# Patient Record
Sex: Female | Born: 1975 | Race: Black or African American | Hispanic: No | Marital: Single | Smoking: Never smoker
Health system: Southern US, Community
[De-identification: ages and names within clinical notes are randomized; demographics above are authoritative.]

## PROBLEM LIST (undated history)

## (undated) ENCOUNTER — Inpatient Hospital Stay (HOSPITAL_COMMUNITY): Payer: Self-pay

## (undated) DIAGNOSIS — Z789 Other specified health status: Secondary | ICD-10-CM

## (undated) DIAGNOSIS — O009 Unspecified ectopic pregnancy without intrauterine pregnancy: Secondary | ICD-10-CM

## (undated) DIAGNOSIS — G43909 Migraine, unspecified, not intractable, without status migrainosus: Secondary | ICD-10-CM

## (undated) HISTORY — PX: ECTOPIC PREGNANCY SURGERY: SHX613

## (undated) HISTORY — PX: UNILATERAL SALPINGECTOMY: SHX6160

## (undated) HISTORY — PX: DILATION AND CURETTAGE OF UTERUS: SHX78

---

## 1998-06-04 ENCOUNTER — Emergency Department (HOSPITAL_COMMUNITY): Admission: EM | Admit: 1998-06-04 | Discharge: 1998-06-04 | Payer: Self-pay | Admitting: Emergency Medicine

## 1998-10-20 ENCOUNTER — Emergency Department (HOSPITAL_COMMUNITY): Admission: EM | Admit: 1998-10-20 | Discharge: 1998-10-20 | Payer: Self-pay

## 1999-05-05 ENCOUNTER — Encounter: Admission: RE | Admit: 1999-05-05 | Discharge: 1999-05-05 | Payer: Self-pay | Admitting: Internal Medicine

## 1999-09-09 ENCOUNTER — Encounter: Admission: RE | Admit: 1999-09-09 | Discharge: 1999-09-09 | Payer: Self-pay | Admitting: Hematology and Oncology

## 1999-11-29 ENCOUNTER — Encounter: Admission: RE | Admit: 1999-11-29 | Discharge: 1999-11-29 | Payer: Self-pay | Admitting: Internal Medicine

## 1999-12-02 ENCOUNTER — Encounter: Admission: RE | Admit: 1999-12-02 | Discharge: 1999-12-02 | Payer: Self-pay

## 2000-04-27 ENCOUNTER — Encounter: Admission: RE | Admit: 2000-04-27 | Discharge: 2000-04-27 | Payer: Self-pay | Admitting: Hematology and Oncology

## 2001-07-04 ENCOUNTER — Encounter: Admission: RE | Admit: 2001-07-04 | Discharge: 2001-07-04 | Payer: Self-pay | Admitting: Internal Medicine

## 2001-12-24 ENCOUNTER — Encounter: Admission: RE | Admit: 2001-12-24 | Discharge: 2001-12-24 | Payer: Self-pay | Admitting: Internal Medicine

## 2001-12-28 ENCOUNTER — Encounter: Admission: RE | Admit: 2001-12-28 | Discharge: 2001-12-28 | Payer: Self-pay | Admitting: Internal Medicine

## 2002-01-15 ENCOUNTER — Encounter: Admission: RE | Admit: 2002-01-15 | Discharge: 2002-01-15 | Payer: Self-pay | Admitting: *Deleted

## 2002-05-13 ENCOUNTER — Inpatient Hospital Stay (HOSPITAL_COMMUNITY): Admission: AD | Admit: 2002-05-13 | Discharge: 2002-05-13 | Payer: Self-pay | Admitting: Obstetrics & Gynecology

## 2002-05-17 ENCOUNTER — Encounter (INDEPENDENT_AMBULATORY_CARE_PROVIDER_SITE_OTHER): Payer: Self-pay | Admitting: *Deleted

## 2002-05-18 ENCOUNTER — Ambulatory Visit (HOSPITAL_COMMUNITY): Admission: RE | Admit: 2002-05-18 | Discharge: 2002-05-18 | Payer: Self-pay | Admitting: Obstetrics and Gynecology

## 2003-04-13 ENCOUNTER — Emergency Department (HOSPITAL_COMMUNITY): Admission: EM | Admit: 2003-04-13 | Discharge: 2003-04-14 | Payer: Self-pay | Admitting: Emergency Medicine

## 2004-03-12 ENCOUNTER — Other Ambulatory Visit: Admission: RE | Admit: 2004-03-12 | Discharge: 2004-03-12 | Payer: Self-pay | Admitting: Obstetrics and Gynecology

## 2004-08-16 ENCOUNTER — Emergency Department (HOSPITAL_COMMUNITY): Admission: EM | Admit: 2004-08-16 | Discharge: 2004-08-16 | Payer: Self-pay | Admitting: Emergency Medicine

## 2004-12-10 ENCOUNTER — Emergency Department (HOSPITAL_COMMUNITY): Admission: EM | Admit: 2004-12-10 | Discharge: 2004-12-10 | Payer: Self-pay | Admitting: Family Medicine

## 2004-12-14 ENCOUNTER — Emergency Department (HOSPITAL_COMMUNITY): Admission: EM | Admit: 2004-12-14 | Discharge: 2004-12-14 | Payer: Self-pay | Admitting: Family Medicine

## 2004-12-16 ENCOUNTER — Emergency Department (HOSPITAL_COMMUNITY): Admission: EM | Admit: 2004-12-16 | Discharge: 2004-12-16 | Payer: Self-pay | Admitting: Emergency Medicine

## 2005-01-11 ENCOUNTER — Ambulatory Visit: Payer: Self-pay | Admitting: Internal Medicine

## 2005-01-14 ENCOUNTER — Ambulatory Visit: Payer: Self-pay | Admitting: Internal Medicine

## 2005-01-20 ENCOUNTER — Ambulatory Visit (HOSPITAL_COMMUNITY): Admission: RE | Admit: 2005-01-20 | Discharge: 2005-01-20 | Payer: Self-pay | Admitting: Internal Medicine

## 2005-02-04 ENCOUNTER — Ambulatory Visit: Payer: Self-pay | Admitting: Internal Medicine

## 2005-02-14 ENCOUNTER — Ambulatory Visit (HOSPITAL_COMMUNITY): Admission: RE | Admit: 2005-02-14 | Discharge: 2005-02-14 | Payer: Self-pay | Admitting: Nephrology

## 2005-02-24 ENCOUNTER — Ambulatory Visit: Payer: Self-pay | Admitting: Internal Medicine

## 2005-04-05 ENCOUNTER — Other Ambulatory Visit: Admission: RE | Admit: 2005-04-05 | Discharge: 2005-04-05 | Payer: Self-pay | Admitting: Obstetrics and Gynecology

## 2005-04-29 ENCOUNTER — Ambulatory Visit (HOSPITAL_COMMUNITY): Admission: RE | Admit: 2005-04-29 | Discharge: 2005-04-29 | Payer: Self-pay | Admitting: Obstetrics and Gynecology

## 2005-05-05 ENCOUNTER — Inpatient Hospital Stay (HOSPITAL_COMMUNITY): Admission: AD | Admit: 2005-05-05 | Discharge: 2005-05-05 | Payer: Self-pay | Admitting: Obstetrics and Gynecology

## 2005-07-18 ENCOUNTER — Ambulatory Visit: Payer: Self-pay | Admitting: Internal Medicine

## 2005-08-22 ENCOUNTER — Ambulatory Visit: Payer: Self-pay | Admitting: Internal Medicine

## 2005-10-05 ENCOUNTER — Emergency Department (HOSPITAL_COMMUNITY): Admission: EM | Admit: 2005-10-05 | Discharge: 2005-10-05 | Payer: Self-pay | Admitting: Family Medicine

## 2005-12-15 DIAGNOSIS — J45909 Unspecified asthma, uncomplicated: Secondary | ICD-10-CM | POA: Insufficient documentation

## 2005-12-15 DIAGNOSIS — J309 Allergic rhinitis, unspecified: Secondary | ICD-10-CM | POA: Insufficient documentation

## 2005-12-20 ENCOUNTER — Inpatient Hospital Stay (HOSPITAL_COMMUNITY): Admission: AD | Admit: 2005-12-20 | Discharge: 2005-12-20 | Payer: Self-pay | Admitting: Obstetrics and Gynecology

## 2005-12-23 ENCOUNTER — Inpatient Hospital Stay (HOSPITAL_COMMUNITY): Admission: AD | Admit: 2005-12-23 | Discharge: 2005-12-23 | Payer: Self-pay | Admitting: Obstetrics and Gynecology

## 2005-12-25 ENCOUNTER — Ambulatory Visit (HOSPITAL_COMMUNITY): Admission: AD | Admit: 2005-12-25 | Discharge: 2005-12-25 | Payer: Self-pay

## 2006-01-25 DIAGNOSIS — R1084 Generalized abdominal pain: Secondary | ICD-10-CM | POA: Insufficient documentation

## 2006-01-25 DIAGNOSIS — D3500 Benign neoplasm of unspecified adrenal gland: Secondary | ICD-10-CM

## 2006-05-19 ENCOUNTER — Ambulatory Visit: Payer: Self-pay | Admitting: Oncology

## 2006-05-29 ENCOUNTER — Emergency Department (HOSPITAL_COMMUNITY): Admission: EM | Admit: 2006-05-29 | Discharge: 2006-05-29 | Payer: Self-pay | Admitting: Family Medicine

## 2006-06-26 LAB — CONVERTED CEMR LAB: Pap Smear: NORMAL

## 2006-11-10 ENCOUNTER — Ambulatory Visit: Payer: Self-pay | Admitting: Oncology

## 2006-11-12 ENCOUNTER — Emergency Department (HOSPITAL_COMMUNITY): Admission: EM | Admit: 2006-11-12 | Discharge: 2006-11-12 | Payer: Self-pay | Admitting: Emergency Medicine

## 2006-11-14 ENCOUNTER — Telehealth: Payer: Self-pay | Admitting: *Deleted

## 2006-11-15 ENCOUNTER — Encounter (INDEPENDENT_AMBULATORY_CARE_PROVIDER_SITE_OTHER): Payer: Self-pay | Admitting: Infectious Diseases

## 2006-11-15 ENCOUNTER — Ambulatory Visit: Payer: Self-pay | Admitting: Internal Medicine

## 2006-11-15 LAB — CONVERTED CEMR LAB
ALT: 8 units/L (ref 0–35)
AST: 12 units/L (ref 0–37)
Albumin: 4.4 g/dL (ref 3.5–5.2)
Alkaline Phosphatase: 41 units/L (ref 39–117)
Anti Nuclear Antibody(ANA): NEGATIVE
BUN: 7 mg/dL (ref 6–23)
Basophils Absolute: 0 10*3/uL (ref 0.0–0.1)
Basophils Relative: 1 % (ref 0–1)
Beta hcg, urine, semiquantitative: NEGATIVE
Bilirubin Urine: NEGATIVE
CO2: 22 meq/L (ref 19–32)
Calcium: 9 mg/dL (ref 8.4–10.5)
Chloride: 106 meq/L (ref 96–112)
Creatinine, Ser: 0.65 mg/dL (ref 0.40–1.20)
Eosinophils Absolute: 0 10*3/uL (ref 0.0–0.7)
Eosinophils Relative: 1 % (ref 0–5)
Glucose, Bld: 81 mg/dL (ref 70–99)
HCT: 35.7 % — ABNORMAL LOW (ref 36.0–46.0)
Hemoglobin, Urine: NEGATIVE
Hemoglobin: 11.8 g/dL — ABNORMAL LOW (ref 12.0–15.0)
Ketones, ur: NEGATIVE mg/dL
Leukocytes, UA: NEGATIVE
Lymphocytes Relative: 48 % — ABNORMAL HIGH (ref 12–46)
Lymphs Abs: 2 10*3/uL (ref 0.7–3.3)
MCHC: 33.1 g/dL (ref 30.0–36.0)
MCV: 91.3 fL (ref 78.0–100.0)
Monocytes Absolute: 0.5 10*3/uL (ref 0.2–0.7)
Monocytes Relative: 11 % (ref 3–11)
Neutro Abs: 1.7 10*3/uL (ref 1.7–7.7)
Neutrophils Relative %: 40 % — ABNORMAL LOW (ref 43–77)
Nitrite: NEGATIVE
Platelets: 262 10*3/uL (ref 150–400)
Potassium: 4 meq/L (ref 3.5–5.3)
Protein, ur: NEGATIVE mg/dL
RBC: 3.91 M/uL (ref 3.87–5.11)
RDW: 13.1 % (ref 11.5–14.0)
Sed Rate: 12 mm/hr (ref 0–22)
Sodium: 139 meq/L (ref 135–145)
Specific Gravity, Urine: 1.011 (ref 1.005–1.03)
TSH: 0.702 microintl units/mL (ref 0.350–5.50)
Total Bilirubin: 0.9 mg/dL (ref 0.3–1.2)
Total Protein: 7.1 g/dL (ref 6.0–8.3)
Urine Glucose: NEGATIVE mg/dL
Urobilinogen, UA: 0.2 (ref 0.0–1.0)
Vitamin B-12: 582 pg/mL (ref 211–911)
WBC: 4.2 10*3/uL (ref 4.0–10.5)
pH: 6.5 (ref 5.0–8.0)

## 2006-11-17 ENCOUNTER — Ambulatory Visit: Payer: Self-pay | Admitting: Internal Medicine

## 2006-11-20 ENCOUNTER — Telehealth: Payer: Self-pay | Admitting: *Deleted

## 2007-01-29 ENCOUNTER — Ambulatory Visit: Payer: Self-pay | Admitting: Internal Medicine

## 2007-01-29 DIAGNOSIS — R209 Unspecified disturbances of skin sensation: Secondary | ICD-10-CM | POA: Insufficient documentation

## 2007-02-05 ENCOUNTER — Telehealth: Payer: Self-pay | Admitting: Internal Medicine

## 2007-02-05 ENCOUNTER — Encounter (INDEPENDENT_AMBULATORY_CARE_PROVIDER_SITE_OTHER): Payer: Self-pay | Admitting: *Deleted

## 2007-02-05 LAB — CONVERTED CEMR LAB
Free T4: 0.8 ng/dL (ref 0.6–1.6)
T3, Free: 3.1 pg/mL (ref 2.3–4.2)
TSH: 1.37 microintl units/mL (ref 0.35–5.50)

## 2007-02-14 ENCOUNTER — Telehealth: Payer: Self-pay | Admitting: Internal Medicine

## 2007-02-22 ENCOUNTER — Telehealth (INDEPENDENT_AMBULATORY_CARE_PROVIDER_SITE_OTHER): Payer: Self-pay | Admitting: *Deleted

## 2007-02-23 ENCOUNTER — Encounter: Payer: Self-pay | Admitting: Internal Medicine

## 2007-02-26 ENCOUNTER — Telehealth: Payer: Self-pay | Admitting: Internal Medicine

## 2007-04-06 ENCOUNTER — Ambulatory Visit: Payer: Self-pay | Admitting: Internal Medicine

## 2007-04-06 DIAGNOSIS — N898 Other specified noninflammatory disorders of vagina: Secondary | ICD-10-CM | POA: Insufficient documentation

## 2007-04-06 LAB — CONVERTED CEMR LAB

## 2007-05-04 ENCOUNTER — Ambulatory Visit: Payer: Self-pay | Admitting: Internal Medicine

## 2007-07-20 ENCOUNTER — Telehealth (INDEPENDENT_AMBULATORY_CARE_PROVIDER_SITE_OTHER): Payer: Self-pay | Admitting: *Deleted

## 2007-07-26 ENCOUNTER — Telehealth: Payer: Self-pay | Admitting: Internal Medicine

## 2007-07-26 ENCOUNTER — Ambulatory Visit: Payer: Self-pay | Admitting: Internal Medicine

## 2007-07-26 LAB — CONVERTED CEMR LAB: Cortisol, Plasma: 0.9 ug/dL

## 2007-08-03 ENCOUNTER — Ambulatory Visit: Payer: Self-pay | Admitting: Internal Medicine

## 2007-08-03 DIAGNOSIS — M2550 Pain in unspecified joint: Secondary | ICD-10-CM | POA: Insufficient documentation

## 2007-08-03 DIAGNOSIS — IMO0001 Reserved for inherently not codable concepts without codable children: Secondary | ICD-10-CM

## 2007-08-03 LAB — CONVERTED CEMR LAB
Cyclic Citrullin Peptide Ab: 0.8 units (ref <7)
Rhuematoid fact SerPl-aCnc: <20 intl units/mL — ABNORMAL LOW (ref 0.0–20.0)
Sed Rate: 10 mm/hr (ref 0–22)
Total CK: 114 units/L (ref 7–177)
Uric Acid, Serum: 5.3 mg/dL (ref 2.4–7.0)

## 2007-08-07 ENCOUNTER — Telehealth: Payer: Self-pay | Admitting: Internal Medicine

## 2007-08-08 ENCOUNTER — Ambulatory Visit: Payer: Self-pay | Admitting: Internal Medicine

## 2007-09-04 ENCOUNTER — Emergency Department (HOSPITAL_COMMUNITY): Admission: EM | Admit: 2007-09-04 | Discharge: 2007-09-04 | Payer: Self-pay | Admitting: Emergency Medicine

## 2007-11-26 ENCOUNTER — Ambulatory Visit: Payer: Self-pay | Admitting: Internal Medicine

## 2007-11-26 DIAGNOSIS — N760 Acute vaginitis: Secondary | ICD-10-CM | POA: Insufficient documentation

## 2007-11-26 LAB — CONVERTED CEMR LAB
Beta hcg, urine, semiquantitative: NEGATIVE
Bilirubin Urine: NEGATIVE
Chlamydia, DNA Probe: NEGATIVE
Clue Cells Wet Prep HPF POC: NONE SEEN
GC Probe Amp, Genital: NEGATIVE
Glucose, Urine, Semiquant: NEGATIVE
Ketones, urine, test strip: NEGATIVE
Nitrite: NEGATIVE
Protein, U semiquant: 100
Specific Gravity, Urine: >=1.03
Trich, Wet Prep: NONE SEEN
Urobilinogen, UA: 0.2
WBC Urine, dipstick: NEGATIVE
Yeast Wet Prep HPF POC: NONE SEEN
pH: 5

## 2007-11-27 ENCOUNTER — Telehealth: Payer: Self-pay | Admitting: Internal Medicine

## 2007-12-25 ENCOUNTER — Ambulatory Visit: Payer: Self-pay | Admitting: Internal Medicine

## 2007-12-25 DIAGNOSIS — H6520 Chronic serous otitis media, unspecified ear: Secondary | ICD-10-CM | POA: Insufficient documentation

## 2008-01-08 ENCOUNTER — Ambulatory Visit: Payer: Self-pay | Admitting: Internal Medicine

## 2008-01-08 DIAGNOSIS — N390 Urinary tract infection, site not specified: Secondary | ICD-10-CM

## 2008-01-08 DIAGNOSIS — A6000 Herpesviral infection of urogenital system, unspecified: Secondary | ICD-10-CM

## 2008-01-08 LAB — CONVERTED CEMR LAB
Bilirubin Urine: NEGATIVE
Glucose, Urine, Semiquant: NEGATIVE
Protein, U semiquant: >=300
Specific Gravity, Urine: 1.015
Urobilinogen, UA: 0.2
pH: 5

## 2008-01-09 ENCOUNTER — Encounter: Payer: Self-pay | Admitting: Internal Medicine

## 2008-01-11 ENCOUNTER — Telehealth: Payer: Self-pay | Admitting: Internal Medicine

## 2008-01-17 ENCOUNTER — Telehealth: Payer: Self-pay | Admitting: Internal Medicine

## 2008-01-22 ENCOUNTER — Ambulatory Visit: Payer: Self-pay | Admitting: Internal Medicine

## 2008-01-22 LAB — CONVERTED CEMR LAB

## 2008-01-31 ENCOUNTER — Telehealth: Payer: Self-pay | Admitting: Internal Medicine

## 2008-02-14 ENCOUNTER — Encounter: Payer: Self-pay | Admitting: Internal Medicine

## 2008-03-03 ENCOUNTER — Telehealth: Payer: Self-pay | Admitting: Internal Medicine

## 2008-04-07 ENCOUNTER — Ambulatory Visit: Payer: Self-pay | Admitting: Internal Medicine

## 2008-04-08 ENCOUNTER — Telehealth (INDEPENDENT_AMBULATORY_CARE_PROVIDER_SITE_OTHER): Payer: Self-pay | Admitting: *Deleted

## 2008-05-08 ENCOUNTER — Telehealth: Payer: Self-pay | Admitting: Internal Medicine

## 2009-06-24 ENCOUNTER — Telehealth: Payer: Self-pay | Admitting: Internal Medicine

## 2009-10-21 ENCOUNTER — Other Ambulatory Visit: Admission: RE | Admit: 2009-10-21 | Discharge: 2009-10-21 | Payer: Self-pay | Admitting: Radiology

## 2010-02-27 ENCOUNTER — Encounter: Payer: Self-pay | Admitting: Internal Medicine

## 2010-02-28 ENCOUNTER — Encounter: Payer: Self-pay | Admitting: Internal Medicine

## 2010-03-11 NOTE — Progress Notes (Signed)
Summary: [facility]  Phone Note Call from Patient Call back at Home Phone [phone]   Caller: Patient Call For: D. Thomos Lemons DO Summary of Call: patient called and left voice message requesting a rx for Valtrex.   Initial call taken by: Glendell Docker CMA,  Jun 24, 2009 12:10 PM  Follow-up for Phone Call        refill x 1 only.  needs ov before refills Follow-up by: D. Thomos Lemons DO,  Jun 24, 2009 12:11 PM  Additional Follow-up for Phone Call Additional follow up Details #1::        attempted to contact patient at [phone], no answer, detailed voice message left informing patient per Dr Artist Pais instructions Additional Follow-up by: Glendell Docker CMA,  Jun 24, 2009 1:01 PM    Prescriptions: ACYCLOVIR 400 MG TABS (ACYCLOVIR) one by mouth bid  #60 x 0   Entered by:   Glendell Docker CMA   Authorized by:   D. Thomos Lemons DO   Signed by:   Glendell Docker CMA on 06/24/2009   Method used:   Electronically to        [facility]. [phone]* (retail)       [address]       [place], [place]  [address]       Ph: [phone]       Fax: [phone]   RxID:   [id number]

## 2010-06-25 NOTE — Op Note (Signed)
Alicia Johnson, Alicia Johnson                         ACCOUNT NO.:  [id number]   MEDICAL RECORD NO.:  [id number]                   PATIENT TYPE:  AMB   LOCATION:  SDC                                  FACILITY:  [facility]   PHYSICIAN:  Miguel Aschoff, M.D.                    DATE OF BIRTH:  [birth year]   DATE OF PROCEDURE:  05/18/2002  DATE OF DISCHARGE:                                 OPERATIVE REPORT   PREOPERATIVE DIAGNOSIS:  Missed abortion.   POSTOPERATIVE DIAGNOSIS:  Missed abortion.   PROCEDURE:  Suction curettage.   SURGEON:  Miguel Aschoff, M.D.   ANESTHESIA:  IV sedation with paracervical block.   COMPLICATIONS:  None.   INDICATIONS:  The patient is a 35 year old black female gravida 1, para 0  who shows to be at 10 weeks' gestation by dates.  Serial ultrasound  examination has revealed a fetal pole consistent with six weeks with no  fetal heart activity on serial observation with malformation of the  gestational sac and no yolk sac noted.  In addition, the patient's hCG fell  from 24,000 down 16,000 associated with vaginal bleeding.  With the  ultrasounds and falling hCGs, diagnosis of missed abortion has been made,  and the patient is to undergo a suction curettage to evacuate the uterus.   Risks and benefits of the procedure were discussed with the patient, and  informed consent has been obtained.   DESCRIPTION OF PROCEDURE:  The patient was taken to the operating room and  placed in the supine position.  IV sedation was administered.  She was then  placed in the dorsal lithotomy position, prepped and draped in the usual  sterile fashion.  The bladder was catheterized.  Examination revealed the  uterus to be anterior, approximately 8 weeks in size.  Specimen was placed  in the vaginal vault.  A small amount of blood was noted to be coming  through the cervix.  The cervix was injected with 18 mL of 1% Xylocaine by  placing 6 mL at the 12, 4 and 8 o'clock positions on the cervix.   The  cervical canal was dilated using serial Pratt dilators until a #25 Pratt  dilator could be passed.  A vacuum curet was then used, and the contents of  the uterus were evacuated without difficulty.  Sharp curettage was carried  out yielding only a small amount of additional tissue.  A final pass was  made with the suction curet, and at this point with the uterus being empty,  the procedure was completed.  The instruments were removed.  Hemostasis was  readily achieved.  At this point, the patient was taken out of the lithotomy  position and taken to the recovery room in satisfactory condition.  Estimated blood loss was approximately 75 mL.   PLAN:  The plan is for the patient to be discharged home.  She  will be seen  back in four weeks for followup examination.  She is to call if there are  any problems such as fever, pain or heavy bleeding.  She is instructed to  place nothing in the vagina for four weeks.  The patient's blood type is O  negative.  She will receive RhoGAM.    DISCHARGE MEDICATIONS:  1. Doxycycline 100 mg twice a day x3 days.  2. Darvocet-N 100 mg as needed for pain.                                                Miguel Aschoff, M.D.    AR/MEDQ  D:  05/18/2002  T:  05/18/2002  Job:  [id number]

## 2010-06-25 NOTE — Op Note (Signed)
Alicia Johnson, Alicia Johnson               ACCOUNT NO.:  [id number]   MEDICAL RECORD NO.:  [id number]          PATIENT TYPE:  AMB   LOCATION:  SDC                           FACILITY:  [facility]   PHYSICIAN:  Malva Limes, M.D.    DATE OF BIRTH:  [birth year]   DATE OF PROCEDURE:  12/25/2005  DATE OF DISCHARGE:                                 OPERATIVE REPORT   PREOPERATIVE DIAGNOSIS:  Ruptured ectopic pregnancy.   POSTOPERATIVE DIAGNOSIS:  Unruptured right ampullar ectopic pregnancy with 8  cm clot.   PROCEDURES:  1. Diagnostic laparoscopy.  2. Removal of hematoperitoneum.  3. Salpingostomy.   SURGEON:  Dr. Dareen Piano.   ANESTHESIA:  General endotracheal.   ANTIBIOTICS:  Ancef 1 gram.   DRAINS:  Foley bedside drainage.   ESTIMATED BLOOD LOSS:  150 mL.   COMPLICATIONS:  None.   SPECIMENS:  None.   INDICATIONS:  [name] is a 35 year old black female with suspected ectopic  pregnancy, last seen on 12/23/2005.  At that time the patient had an  ultrasound which revealed no hematometrium and a questionable 4 cm mass in  the right adnexa.  The patient's quants had been dropping.  She did receive  methotrexate on that day.  The patient returned today with increased amount  of pain, an ultrasound was performed and the patient was found to have an 8  cm mass.  In light of this, the patient was taken to the operating room for  salpingostomy.   PROCEDURE:  The patient was prepped with Betadine and placed in the dorsal  lithotomy position.  Foley catheter was placed in her bladder.  Hulka  tenaculum was applied to the anterior cervical lip.  The umbilicus was then  opened in a linear fashion.  The fascia was grasped, opened, the parietal  peritoneum opened and a suture placed.  Hasson cannula was placed in the  peritoneal cavity and 3 liters of carbon dioxide were insufflated.  The  patient was placed in Trendelenburg.  On placement of the scope, the patient  was found to have hematometrium  with a large clot in right adnexa.  A 10-mm  port was placed in the suprapubic region and a 5-mm port placed in the right  lower quadrant, under direct visualization.  At this point, the suction  irrigator was used to suck out the hematoperitoneum and remove the clot.  When this was done the patient was discovered to have a 2 cm ectopic in the  right ampullary portion.  The needlepoint gyrus was then used to open the  antimesenteric portion of the tube over the ectopic.  The suction irrigator  was placed into the opening and fluid used to push the ectopic pregnancy  out.  This was then sucked up into the canister.  This concluded the  procedure.  Minimal bleeding was noted after that.  The appendix appeared to  be normal.  Ovaries were normal.  The other fallopian tube appeared to be  normal.  There was no evidence of endometriosis or adhesions.  The fimbria  on both tubes appeared to be  normal.  The patient had no evidence of past  PID.  At this point, pneumoperitoneum was released.  The fascia was closed  with 0 Monocryl suture and skin closed with 4-0 Vicryl suture.  The lower  incisions were closed with Dermabond.  The patient tolerated the procedure  well.  She was discharged to home.  She was sent home with Percocet to take  p.r.n.  She will follow up in the office in two weeks.           ______________________________  Malva Limes, M.D.     MA/MEDQ  D:  12/25/2005  T:  12/25/2005  Job:  [id number]

## 2010-09-06 ENCOUNTER — Emergency Department (HOSPITAL_COMMUNITY)
Admission: EM | Admit: 2010-09-06 | Discharge: 2010-09-07 | Disposition: A | Discharge status: Home / Self Care | Payer: BC Managed Care – PPO | Attending: Emergency Medicine | Admitting: Emergency Medicine

## 2010-09-06 DIAGNOSIS — R209 Unspecified disturbances of skin sensation: Secondary | ICD-10-CM | POA: Insufficient documentation

## 2010-09-06 DIAGNOSIS — R229 Localized swelling, mass and lump, unspecified: Secondary | ICD-10-CM | POA: Insufficient documentation

## 2010-09-07 LAB — BASIC METABOLIC PANEL
CO2: 27 mEq/L (ref 19–32)
Calcium: 9.1 mg/dL (ref 8.4–10.5)
Creatinine, Ser: 0.58 mg/dL (ref 0.50–1.10)
GFR calc non Af Amer: >60 mL/min (ref >60)
Glucose, Bld: 91 mg/dL (ref 70–99)
Sodium: 135 mEq/L (ref 135–145)

## 2010-09-07 LAB — D-DIMER, QUANTITATIVE: D-Dimer, Quant: 0.29 ug/mL-FEU (ref 0.00–0.48)

## 2010-11-24 ENCOUNTER — Other Ambulatory Visit: Payer: Self-pay | Admitting: Family Medicine

## 2010-11-24 DIAGNOSIS — R1030 Lower abdominal pain, unspecified: Secondary | ICD-10-CM

## 2010-11-29 ENCOUNTER — Other Ambulatory Visit: Payer: Self-pay | Admitting: Internal Medicine

## 2010-11-29 DIAGNOSIS — R1030 Lower abdominal pain, unspecified: Secondary | ICD-10-CM

## 2010-11-30 ENCOUNTER — Ambulatory Visit
Admission: RE | Admit: 2010-11-30 | Discharge: 2010-11-30 | Disposition: A | Discharge status: Home / Self Care | Payer: BC Managed Care – PPO | Source: Ambulatory Visit | Attending: Family Medicine | Admitting: Family Medicine

## 2010-11-30 DIAGNOSIS — R1030 Lower abdominal pain, unspecified: Secondary | ICD-10-CM

## 2010-11-30 MED ORDER — IOHEXOL 300 MG/ML  SOLN
100.0000 mL | Freq: Once | INTRAMUSCULAR | Status: AC | PRN
  Administered 2010-11-30: 100 mL via INTRAVENOUS

## 2010-12-27 ENCOUNTER — Ambulatory Visit: Payer: No Typology Code available for payment source

## 2010-12-27 NOTE — Progress Notes (Signed)
Pt did not know what cancer her mother had at age 35. She will email the info and then we can determine if any testing is indicated. CSN: [id number]

## 2011-01-24 ENCOUNTER — Ambulatory Visit: Payer: No Typology Code available for payment source

## 2011-03-09 ENCOUNTER — Telehealth: Payer: Self-pay | Admitting: Genetic Counselor

## 2011-03-14 ENCOUNTER — Telehealth: Payer: Self-pay | Admitting: Genetic Counselor

## 2011-09-20 ENCOUNTER — Telehealth: Payer: Self-pay | Admitting: Genetic Counselor

## 2011-09-20 NOTE — Telephone Encounter (Signed)
Emailed Ms. Wannamaker about information I found out about her mother Astryd Pearcy.  Keta called last week wondering why she had not been tested for BRCA mutations, and that she now wants to be tested b/c her maternal aunt has recently been diagnosed with breast cancer.  She states that her mother died at age 36 from lung cancer, and that she was followed by Dr. Yolanda Bonine.  She asked that we keep in touch by email: [email or web address].

## 2011-09-21 ENCOUNTER — Telehealth: Payer: Self-pay | Admitting: *Deleted

## 2011-09-21 NOTE — Telephone Encounter (Signed)
Patient called requesting a genetic appt.  Confirmed 11/03/11 genetic appt w/ pt.

## 2011-11-03 ENCOUNTER — Other Ambulatory Visit: Payer: BC Managed Care – PPO | Admitting: Lab

## 2011-11-03 ENCOUNTER — Encounter: Payer: BC Managed Care – PPO | Admitting: Genetic Counselor

## 2011-11-03 ENCOUNTER — Telehealth: Payer: Self-pay | Admitting: *Deleted

## 2011-11-03 NOTE — Telephone Encounter (Signed)
Patient called stating that she wants to cancel her appt for today and wait until her aunt is tested on Monday and they get the results back.

## 2011-11-07 ENCOUNTER — Telehealth: Payer: Self-pay | Admitting: *Deleted

## 2011-11-07 NOTE — Telephone Encounter (Signed)
Patient called and I confirmed 01/12/12 genetic appt w/ her.

## 2011-11-11 ENCOUNTER — Emergency Department (HOSPITAL_COMMUNITY)
Admission: EM | Admit: 2011-11-11 | Discharge: 2011-11-12 | Disposition: A | Discharge status: Home / Self Care | Payer: BC Managed Care – PPO | Attending: Emergency Medicine | Admitting: Emergency Medicine

## 2011-11-11 ENCOUNTER — Encounter (HOSPITAL_COMMUNITY): Payer: Self-pay

## 2011-11-11 DIAGNOSIS — S161XXA Strain of muscle, fascia and tendon at neck level, initial encounter: Secondary | ICD-10-CM

## 2011-11-11 DIAGNOSIS — S139XXA Sprain of joints and ligaments of unspecified parts of neck, initial encounter: Secondary | ICD-10-CM | POA: Insufficient documentation
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
## 2011-11-12 MED ORDER — CYCLOBENZAPRINE HCL 10 MG PO TABS: 10.0000 mg | ORAL_TABLET | Freq: Two times a day (BID) | ORAL | Status: DC | PRN

## 2011-11-12 NOTE — ED Notes (Signed)
Patient given copy of discharge paperwork; went over discharge instructions with patient.  Patient instructed to take prescriptions as directed; to follow up with primary care physician, and to return to the ED for new, worsening, or concerning symptoms.

## 2011-12-01 ENCOUNTER — Encounter (HOSPITAL_COMMUNITY): Payer: Self-pay | Admitting: *Deleted

## 2011-12-01 ENCOUNTER — Emergency Department (HOSPITAL_COMMUNITY)
Admission: EM | Admit: 2011-12-01 | Discharge: 2011-12-01 | Disposition: A | Discharge status: Home / Self Care | Payer: BC Managed Care – PPO | Source: Home / Self Care | Attending: Emergency Medicine | Admitting: Emergency Medicine

## 2011-12-01 DIAGNOSIS — J329 Chronic sinusitis, unspecified: Secondary | ICD-10-CM

## 2011-12-01 MED ORDER — AZITHROMYCIN 250 MG PO TABS: ORAL_TABLET | ORAL | Status: DC

## 2011-12-01 MED ORDER — CETIRIZINE HCL 10 MG PO CHEW: 10.0000 mg | CHEWABLE_TABLET | Freq: Every day | ORAL | Status: DC

## 2011-12-01 MED ORDER — SALINE NASAL SPRAY 0.65 % NA SOLN: 1.0000 | NASAL | Status: DC | PRN

## 2011-12-01 NOTE — ED Provider Notes (Signed)
History     CSN: [id number]  Arrival date & time 12/01/11  1729   None     Chief Complaint  Patient presents with  . URI    (Consider location/radiation/quality/duration/timing/severity/associated sxs/prior treatment) Patient is a 36 y.o. female presenting with URI. The history is provided by the patient.  URI The primary symptoms include cough. Primary symptoms do not include fever or wheezing. The current episode started more than 1 week ago. This is a new problem. The problem has not changed since onset. The onset of the illness is associated with exposure to sick contacts. Symptoms associated with the illness include chills, plugged ear sensation, facial pain, sinus pressure, congestion and rhinorrhea. The following treatments were addressed: Acetaminophen was effective (somewhat). A decongestant was effective.    History reviewed. No pertinent past medical history.  History reviewed. No pertinent past surgical history.  History reviewed. No pertinent family history.  History  Substance Use Topics  . Smoking status: Never Smoker   . Smokeless tobacco: Not on file  . Alcohol Use: No    OB History    Grav Para Term Preterm Abortions TAB SAB Ect Mult Living                  Review of Systems  Constitutional: Positive for chills. Negative for fever.  HENT: Positive for congestion, rhinorrhea and sinus pressure.   Respiratory: Positive for cough. Negative for choking, chest tightness and wheezing.   Cardiovascular: Negative.     Allergies  Review of patient's allergies indicates no known allergies.  Home Medications   Current Outpatient Rx  Name Route Sig Dispense Refill  . AZITHROMYCIN 250 MG PO TABS  Azithromycin 500mg  on day 1, then 250mg  on days 2-4.  Rx to be filled 12/04/11 thru 12/08/11 only. 6 tablet 0  . CETIRIZINE HCL 10 MG PO CHEW Oral Chew 1 tablet (10 mg total) by mouth daily. 30 tablet 1  . CYCLOBENZAPRINE HCL 10 MG PO TABS Oral Take 1 tablet (10  mg total) by mouth 2 (two) times daily as needed for muscle spasms. 20 tablet 0  . SALINE NASAL SPRAY 0.65 % NA SOLN Nasal Place 1 spray into the nose as needed for congestion. 30 mL 12    BP 135/92  Pulse 79  Temp 98.9 F (37.2 C) (Oral)  Resp 20  SpO2 100%  LMP 11/02/2011  Physical Exam  Nursing note and vitals reviewed. Constitutional: She is oriented to person, place, and time. Vital signs are normal. She appears well-developed and well-nourished. She is active and cooperative. No distress.  HENT:  Head: Normocephalic.  Right Ear: External ear normal.  Left Ear: External ear normal.  Nose: Nose normal.  Mouth/Throat: Oropharynx is clear and moist.       Fluid in right TM, left TM erythema  Eyes: Conjunctivae normal and EOM are normal. Pupils are equal, round, and reactive to light. No scleral icterus.  Neck: Trachea normal and normal range of motion. Neck supple.  Cardiovascular: Normal rate, regular rhythm and normal heart sounds.   Pulmonary/Chest: Effort normal and breath sounds normal. No respiratory distress. She has no wheezes.  Musculoskeletal: Normal range of motion.  Lymphadenopathy:    She has no cervical adenopathy.  Neurological: She is alert and oriented to person, place, and time. No cranial nerve deficit or sensory deficit.  Skin: Skin is warm and dry. No rash noted. She is not diaphoretic.  Psychiatric: She has a normal mood and affect. Her  speech is normal and behavior is normal. Judgment and thought content normal. Cognition and memory are normal.    ED Course  Procedures (including critical care time)  Labs Reviewed - No data to display No results found.   1. Sinusitis       MDM  Antihistamine of your choice, nasal saline spray daily.  Begin antibiotics if symptoms are not improved in 3-4 days.          Johnsie Kindred, NP 12/01/11 1831

## 2011-12-01 NOTE — ED Notes (Signed)
C/o sorethroat onset Wed. of last week, then got cough, ears stopped up, and a little runny nose- more stuffy.  Had some chills but did not note a fever.

## 2011-12-02 NOTE — ED Provider Notes (Signed)
Medical screening examination/treatment/procedure(s) were performed by non-physician practitioner and as supervising physician I was immediately available for consultation/collaboration.  Carneshia Raker, M.D.   Olga Bourbeau C Witten Certain, MD 12/02/11 1256 

## 2011-12-23 ENCOUNTER — Inpatient Hospital Stay (HOSPITAL_COMMUNITY)
Admission: AD | Admit: 2011-12-23 | Discharge: 2011-12-23 | Disposition: A | Discharge status: Home / Self Care | Payer: BC Managed Care – PPO | Source: Ambulatory Visit | Attending: Obstetrics and Gynecology | Admitting: Obstetrics and Gynecology

## 2011-12-23 ENCOUNTER — Encounter (HOSPITAL_COMMUNITY): Payer: Self-pay | Admitting: *Deleted

## 2011-12-23 DIAGNOSIS — O00109 Unspecified tubal pregnancy without intrauterine pregnancy: Secondary | ICD-10-CM | POA: Insufficient documentation

## 2011-12-23 LAB — CBC WITH DIFFERENTIAL/PLATELET
Eosinophils Absolute: 0.2 10*3/uL (ref 0.0–0.7)
HCT: 31.7 % — ABNORMAL LOW (ref 36.0–46.0)
Hemoglobin: 10.6 g/dL — ABNORMAL LOW (ref 12.0–15.0)
Lymphs Abs: 2.4 10*3/uL (ref 0.7–4.0)
MCH: 30.2 pg (ref 26.0–34.0)
MCHC: 33.4 g/dL (ref 30.0–36.0)
Monocytes Absolute: 0.7 10*3/uL (ref 0.1–1.0)
Monocytes Relative: 10 % (ref 3–12)
Neutrophils Relative %: 53 % (ref 43–77)
RBC: 3.51 MIL/uL — ABNORMAL LOW (ref 3.87–5.11)

## 2011-12-23 LAB — TYPE AND SCREEN
ABO/RH(D): O NEG
Antibody Screen: NEGATIVE

## 2011-12-23 MED ORDER — METHOTREXATE INJECTION FOR WOMEN'S HOSPITAL
50.0000 mg/m2 | Freq: Once | INTRAMUSCULAR | Status: AC
  Administered 2011-12-23: 100 mg via INTRAMUSCULAR
  Filled 2011-12-23: qty 2

## 2011-12-23 MED ORDER — RHO D IMMUNE GLOBULIN 1500 UNIT/2ML IJ SOLN
300.0000 ug | Freq: Once | INTRAMUSCULAR | Status: AC
  Administered 2011-12-23: 300 ug via INTRAMUSCULAR
  Filled 2011-12-23: qty 2

## 2011-12-23 NOTE — MAU Provider Note (Signed)
Patient presented to office 11/13 with early pregnancy, bleeding, and abdominal pain.  Ultrasound showed an empty uterus with thickened endometrium.  Moderate free fluid was noted in the pelvis.  No adnexal masses were noted.  Her Quant. HCG was 571 Units.  Today she reports that her symptoms are no worse or better.  Repeat HCG was 553 Units.  Impression:  Based on plateau values of HCG, bleeding, and ultrasound I believe that there is no possibility of this being a normal pregnancy.  She has a history of previous ectopic and I believe that her presentation is most consistent with another ectopic pregnancy although missed ab is also a possibility.  I discussed this with the patient and will proceed with Methotrexate therapy today. Plan:  Mtx 50 mg/m2 IM, RhoGAM, CBC.  Will present to the office on 11/19 and 11/22 for follow up HCG's.

## 2011-12-23 NOTE — Progress Notes (Signed)
Written and verbal d/c instructions given and understanding voiced. To f/u at office on Tues. and call with any concerns before then. Has handouts for Rhophylac, MTX, Ectopic folder.

## 2011-12-23 NOTE — MAU Note (Signed)
They have been following me in office and decided I have an ectopic. I'm here MTX and I am O neg so need that shot

## 2011-12-23 NOTE — MAU Note (Signed)
CBC with diff results called to Dr Arlyce Dice. Does not want CMET run and is ok with proceeding with MTX. Pt will f/u in office on Tues and Fri and pt is aware and agrees with plan.

## 2011-12-24 LAB — RH IG WORKUP (INCLUDES ABO/RH)
ABO/RH(D): O NEG
Gestational Age(Wks): 6
Unit division: 0

## 2012-01-12 ENCOUNTER — Other Ambulatory Visit: Payer: BC Managed Care – PPO | Admitting: Lab

## 2012-01-12 ENCOUNTER — Other Ambulatory Visit: Payer: BC Managed Care – PPO

## 2012-01-12 ENCOUNTER — Ambulatory Visit (HOSPITAL_BASED_OUTPATIENT_CLINIC_OR_DEPARTMENT_OTHER): Payer: BC Managed Care – PPO | Admitting: Genetic Counselor

## 2012-01-12 ENCOUNTER — Encounter: Payer: Self-pay | Admitting: Genetic Counselor

## 2012-01-12 ENCOUNTER — Encounter: Payer: BC Managed Care – PPO | Admitting: Genetic Counselor

## 2012-01-12 DIAGNOSIS — IMO0002 Reserved for concepts with insufficient information to code with codable children: Secondary | ICD-10-CM

## 2012-01-12 DIAGNOSIS — Z803 Family history of malignant neoplasm of breast: Secondary | ICD-10-CM

## 2012-01-12 NOTE — Progress Notes (Signed)
Dr.  Jeralyn Ruths requested a consultation for genetic counseling and risk assessment for Alicia Johnson, a 36 y.o. female, for discussion of her family history of breast and colon cancer. She presents to clinic today to discuss the possibility of a genetic predisposition to cancer, and to further clarify her risks, as well as her family members' risks for cancer.   HISTORY OF PRESENT ILLNESS: Alicia Johnson is a 36 y.o. female with no personal history of cancer.    History reviewed. No pertinent past medical history.  History reviewed. No pertinent past surgical history.  History  Substance Use Topics  . Smoking status: Never Smoker   . Smokeless tobacco: Not on file  . Alcohol Use: No    REPRODUCTIVE HISTORY AND PERSONAL RISK ASSESSMENT FACTORS: Menarche was at age 57.   Premenopausal Uterus Intact: Yes Ovaries Intact: Yes [id number] , first live birth at age N/A  She has not previously undergone treatment for infertility.   OCP use for 4-5 years   She has not used HRT in the past.    FAMILY HISTORY:  We obtained a detailed, 4-generation family history.  Significant diagnoses are listed below: Family History  Problem Relation Age of Onset  . Breast cancer Mother 79  . Breast cancer Maternal Aunt 49    tested negative for BRCAPlus  . Colon cancer Maternal Grandfather 43  The patient has never been diagnosed with cancer.  Her mother was diagnosed with breast cancer at age 36 and died at age 89.  Her mother had three sisters and four brothers.  One sister was diagnosed with breast cancer at age 29 and tested negative for BRCAPlus - which looks at 6 high risk hereditary cancer genes.  The patient's maternal grandfather was diagnosed with colon cancer at age 62 and died at 64.  There is no other reported cancer history.  Patient's maternal ancestors are of [place], [place] and Caucasian descent, and paternal ancestors are of African American descent. There is  no reported Ashkenazi Jewish ancestry. There is no  known consanguinity.  GENETIC COUNSELING RISK ASSESSMENT, DISCUSSION, AND SUGGESTED FOLLOW UP: We reviewed the natural history and genetic etiology of sporadic, familial and hereditary cancer syndromes.  About 5-10% of breast cancer is hereditary.  Of this, about 85% is the result of a BRCA1 or BRCA2 mutation.  We reviewed the red flags of hereditary cancer syndromes and the dominant inheritance patterns.  If the BRCA testing is negative, we discussed that we could be testing for the wrong gene.  We discussed gene panels, and that several cancer genes that are associated with different cancers can be tested at the same time.  Because of the different types of cancer that are in the patient's family, we will consider one of the panel tests if she is negative for BRCA mutations.   The patient's family history is suggestive of the following possible diagnosis: hereditary cancer syndrome  We discussed that identification of a hereditary cancer syndrome may help her care providers tailor the patients medical management. If a mutation indicating a hereditary cancer syndrome is detected in this case, the [employer] recommendations would include increased cancer screening and possible prophylactic surgery. If a mutation is detected, the patient will be referred back to the referring provider and to any additional appropriate care providers to discuss the relevant options.   If a mutation is not found in the patient, cancer surveillance options would be discussed for the  patient according to the appropriate standard [employer] and American Cancer Society guidelines, with consideration of their personal and family history risk factors. In this case, the patient will be referred back to their care providers for discussions of management.   In order to estimate her chance of having a BRCA mutation, we used  statistical models ([employer]) and laboratory data that take into account her personal medical history, family history and ancestry.  Because each model is different, there can be a lot of variability in the risks they give.  Therefore, these numbers must be considered a rough range and not a precise risk of having a BRCA mutation.  These models estimate that she has approximately a 0.56-10% chance of having a mutation. Based on this assessment of her family and personal history, genetic testing is recommended.  Based on the patient's personal and family history, statistical models (Tyrer Cusik)  and literature data were used to estimate her risk of developing breast cancer. These estimate her lifetime risk of developing breast cancer to be approximately 36%. This estimation does not take into account any genetic testing results.   After considering the risks, benefits, and limitations, the patient provided informed consent for  the following  testing: Breast and Ovarian cancer panel through [facility].   Per the patient's request, we will contact her by telephone to discuss these results. A follow up genetic counseling visit will be scheduled if indicated.  The patient was seen for a total of 60 minutes, greater than 50% of which was spent face-to-face counseling.  This plan is being carried out per Dr. Wilson Singer recommendations.  This note will also be sent to the referring provider via the electronic medical record. The patient will be supplied with a summary of this genetic counseling discussion as well as educational information on the discussed hereditary cancer syndromes following the conclusion of their visit.   Patient was discussed with Dr. Drue Second.    _______________________________________________________________________ For Office Staff:  Number of people involved in session: 2 Was an Intern/ student involved with case: no

## 2012-01-14 ENCOUNTER — Observation Stay (HOSPITAL_COMMUNITY)
Admission: AD | Admit: 2012-01-14 | Discharge: 2012-01-15 | Disposition: A | Discharge status: Home / Self Care | Payer: BC Managed Care – PPO | Source: Ambulatory Visit | Attending: Obstetrics and Gynecology | Admitting: Obstetrics and Gynecology

## 2012-01-14 ENCOUNTER — Inpatient Hospital Stay (HOSPITAL_COMMUNITY): Payer: BC Managed Care – PPO

## 2012-01-14 ENCOUNTER — Encounter (HOSPITAL_COMMUNITY): Payer: Self-pay | Admitting: Anesthesiology

## 2012-01-14 ENCOUNTER — Ambulatory Visit: Admit: 2012-01-14 | Payer: Self-pay | Admitting: Obstetrics and Gynecology

## 2012-01-14 ENCOUNTER — Encounter (HOSPITAL_COMMUNITY): Payer: Self-pay | Admitting: *Deleted

## 2012-01-14 ENCOUNTER — Encounter (HOSPITAL_COMMUNITY): Payer: Self-pay

## 2012-01-14 ENCOUNTER — Encounter (HOSPITAL_COMMUNITY)
Admission: AD | Disposition: A | Discharge status: Home / Self Care | Payer: Self-pay | Source: Ambulatory Visit | Attending: Obstetrics and Gynecology

## 2012-01-14 ENCOUNTER — Inpatient Hospital Stay (HOSPITAL_COMMUNITY): Payer: BC Managed Care – PPO | Admitting: Anesthesiology

## 2012-01-14 DIAGNOSIS — O00109 Unspecified tubal pregnancy without intrauterine pregnancy: Principal | ICD-10-CM | POA: Insufficient documentation

## 2012-01-14 DIAGNOSIS — R109 Unspecified abdominal pain: Secondary | ICD-10-CM | POA: Insufficient documentation

## 2012-01-14 DIAGNOSIS — D649 Anemia, unspecified: Secondary | ICD-10-CM | POA: Insufficient documentation

## 2012-01-14 DIAGNOSIS — K661 Hemoperitoneum: Secondary | ICD-10-CM | POA: Insufficient documentation

## 2012-01-14 HISTORY — DX: Other specified health status: Z78.9

## 2012-01-14 HISTORY — DX: Unspecified ectopic pregnancy without intrauterine pregnancy: O00.90

## 2012-01-14 HISTORY — PX: LAPAROSCOPY: SHX197

## 2012-01-14 LAB — CBC
Hemoglobin: 11.7 g/dL — ABNORMAL LOW (ref 12.0–15.0)
MCH: 30 pg (ref 26.0–34.0)
MCHC: 33.1 g/dL (ref 30.0–36.0)
MCV: 90.5 fL (ref 78.0–100.0)
RBC: 3.9 MIL/uL (ref 3.87–5.11)

## 2012-01-14 SURGERY — LAPAROSCOPY OPERATIVE
Anesthesia: General | Site: Abdomen

## 2012-01-14 MED ORDER — HYDROMORPHONE HCL PF 1 MG/ML IJ SOLN
1.0000 mg | Freq: Once | INTRAMUSCULAR | Status: AC
  Administered 2012-01-14: 1 mg via INTRAVENOUS
  Filled 2012-01-14: qty 1

## 2012-01-14 MED ORDER — MENTHOL 3 MG MT LOZG: 1.0000 | LOZENGE | OROMUCOSAL | Status: DC | PRN

## 2012-01-14 MED ORDER — METOCLOPRAMIDE HCL 5 MG/ML IJ SOLN: 10.0000 mg | Freq: Once | INTRAMUSCULAR | Status: DC | PRN

## 2012-01-14 MED ORDER — OXYCODONE-ACETAMINOPHEN 5-325 MG PO TABS
1.0000 | ORAL_TABLET | ORAL | Status: DC | PRN
  Administered 2012-01-15 (×2): 1 via ORAL
  Filled 2012-01-14 (×2): qty 1

## 2012-01-14 MED ORDER — DIPHENHYDRAMINE HCL 12.5 MG/5ML PO ELIX
12.5000 mg | ORAL_SOLUTION | Freq: Four times a day (QID) | ORAL | Status: DC | PRN
  Filled 2012-01-14: qty 5

## 2012-01-14 MED ORDER — NALOXONE HCL 0.4 MG/ML IJ SOLN: 0.4000 mg | INTRAMUSCULAR | Status: DC | PRN

## 2012-01-14 MED ORDER — LACTATED RINGERS IV SOLN
INTRAVENOUS | Status: DC
  Administered 2012-01-14 – 2012-01-15 (×3): via INTRAVENOUS

## 2012-01-14 MED ORDER — MIDAZOLAM HCL 5 MG/5ML IJ SOLN
INTRAMUSCULAR | Status: DC | PRN
  Administered 2012-01-14: 2 mg via INTRAVENOUS

## 2012-01-14 MED ORDER — LACTATED RINGERS IV SOLN
INTRAVENOUS | Status: DC | PRN
  Administered 2012-01-14 (×3): via INTRAVENOUS

## 2012-01-14 MED ORDER — ROCURONIUM BROMIDE 100 MG/10ML IV SOLN
INTRAVENOUS | Status: DC | PRN
  Administered 2012-01-14: 30 mg via INTRAVENOUS
  Administered 2012-01-14: 2.5 mg via INTRAVENOUS

## 2012-01-14 MED ORDER — FENTANYL CITRATE 0.05 MG/ML IJ SOLN
INTRAMUSCULAR | Status: DC | PRN
  Administered 2012-01-14: 150 ug via INTRAVENOUS
  Administered 2012-01-14 (×2): 100 ug via INTRAVENOUS

## 2012-01-14 MED ORDER — HYDROMORPHONE HCL PF 1 MG/ML IJ SOLN: 0.2500 mg | INTRAMUSCULAR | Status: DC | PRN

## 2012-01-14 MED ORDER — ONDANSETRON HCL 4 MG/2ML IJ SOLN
INTRAMUSCULAR | Status: DC | PRN
  Administered 2012-01-14: 4 mg via INTRAVENOUS

## 2012-01-14 MED ORDER — LACTATED RINGERS IV SOLN: INTRAVENOUS | Status: DC

## 2012-01-14 MED ORDER — GLYCOPYRROLATE 0.2 MG/ML IJ SOLN
INTRAMUSCULAR | Status: DC | PRN
  Administered 2012-01-14: .8 mg via INTRAVENOUS

## 2012-01-14 MED ORDER — ONDANSETRON HCL 4 MG/2ML IJ SOLN
4.0000 mg | Freq: Four times a day (QID) | INTRAMUSCULAR | Status: DC | PRN
  Administered 2012-01-14: 4 mg via INTRAVENOUS
  Filled 2012-01-14: qty 2

## 2012-01-14 MED ORDER — LIDOCAINE-EPINEPHRINE (PF) 1 %-1:200000 IJ SOLN
INTRAMUSCULAR | Status: DC | PRN
  Administered 2012-01-14: 7 mL

## 2012-01-14 MED ORDER — DEXTROSE 5 % IV SOLN
2.0000 g | INTRAVENOUS | Status: DC | PRN
  Administered 2012-01-14: 2 g via INTRAVENOUS

## 2012-01-14 MED ORDER — DIPHENHYDRAMINE HCL 50 MG/ML IJ SOLN: 12.5000 mg | Freq: Four times a day (QID) | INTRAMUSCULAR | Status: DC | PRN

## 2012-01-14 MED ORDER — CEFAZOLIN SODIUM-DEXTROSE 2-3 GM-% IV SOLR
2.0000 g | Freq: Once | INTRAVENOUS | Status: DC
  Filled 2012-01-14: qty 50

## 2012-01-14 MED ORDER — SUCCINYLCHOLINE CHLORIDE 20 MG/ML IJ SOLN
INTRAMUSCULAR | Status: DC | PRN
  Administered 2012-01-14: 100 mg via INTRAVENOUS

## 2012-01-14 MED ORDER — PROPOFOL 10 MG/ML IV EMUL
INTRAVENOUS | Status: DC | PRN
  Administered 2012-01-14: 160 mg via INTRAVENOUS

## 2012-01-14 MED ORDER — DEXAMETHASONE SODIUM PHOSPHATE 4 MG/ML IJ SOLN
INTRAMUSCULAR | Status: DC | PRN
  Administered 2012-01-14: 10 mg via INTRAVENOUS

## 2012-01-14 MED ORDER — METOCLOPRAMIDE HCL 5 MG/ML IJ SOLN
10.0000 mg | Freq: Once | INTRAMUSCULAR | Status: AC
  Administered 2012-01-14: 10 mg via INTRAVENOUS
  Filled 2012-01-14: qty 2

## 2012-01-14 MED ORDER — PHENYLEPHRINE HCL 10 MG/ML IJ SOLN: INTRAMUSCULAR | Status: DC | PRN

## 2012-01-14 MED ORDER — FAMOTIDINE IN NACL 20-0.9 MG/50ML-% IV SOLN
20.0000 mg | Freq: Once | INTRAVENOUS | Status: AC
  Administered 2012-01-14: 20 mg via INTRAVENOUS
  Filled 2012-01-14: qty 50

## 2012-01-14 MED ORDER — SUCCINYLCHOLINE CHLORIDE 20 MG/ML IJ SOLN: INTRAMUSCULAR | Status: DC | PRN

## 2012-01-14 MED ORDER — KETOROLAC TROMETHAMINE 60 MG/2ML IM SOLN
60.0000 mg | Freq: Once | INTRAMUSCULAR | Status: AC
  Administered 2012-01-14: 60 mg via INTRAMUSCULAR
  Filled 2012-01-14: qty 2

## 2012-01-14 MED ORDER — NEOSTIGMINE METHYLSULFATE 1 MG/ML IJ SOLN
INTRAMUSCULAR | Status: DC | PRN
  Administered 2012-01-14: 4 mg via INTRAVENOUS

## 2012-01-14 MED ORDER — MEPERIDINE HCL 25 MG/ML IJ SOLN: 6.2500 mg | INTRAMUSCULAR | Status: DC | PRN

## 2012-01-14 MED ORDER — IBUPROFEN 600 MG PO TABS
600.0000 mg | ORAL_TABLET | Freq: Four times a day (QID) | ORAL | Status: DC | PRN
  Administered 2012-01-15 (×2): 600 mg via ORAL
  Filled 2012-01-14 (×2): qty 1

## 2012-01-14 MED ORDER — PHENYLEPHRINE HCL 10 MG/ML IJ SOLN
INTRAMUSCULAR | Status: DC | PRN
  Administered 2012-01-14: .12 mg via INTRAVENOUS
  Administered 2012-01-14: .08 mg via INTRAVENOUS

## 2012-01-14 MED ORDER — MORPHINE SULFATE (PF) 1 MG/ML IV SOLN
INTRAVENOUS | Status: DC
  Administered 2012-01-14: 18:00:00 via INTRAVENOUS
  Filled 2012-01-14: qty 25

## 2012-01-14 MED ORDER — LIDOCAINE HCL (CARDIAC) 20 MG/ML IV SOLN
INTRAVENOUS | Status: DC | PRN
  Administered 2012-01-14: 40 mg via INTRAVENOUS

## 2012-01-14 MED ORDER — SODIUM CHLORIDE 0.9 % IJ SOLN: 9.0000 mL | INTRAMUSCULAR | Status: DC | PRN

## 2012-01-14 SURGICAL SUPPLY — 35 items
ADH SKN CLS APL DERMABOND .7 (GAUZE/BANDAGES/DRESSINGS) ×2
BAG SPEC RTRVL LRG 6X4 10 (ENDOMECHANICALS) ×1
CANISTER SUCTION 2500CC (MISCELLANEOUS) ×2 IMPLANT
CATH ROBINSON RED A/P 16FR (CATHETERS) ×1 IMPLANT
CLOTH BEACON ORANGE TIMEOUT ST (SAFETY) ×2 IMPLANT
CONT PATH 16OZ SNAP LID 3702 (MISCELLANEOUS) ×1 IMPLANT
DECANTER SPIKE VIAL GLASS SM (MISCELLANEOUS) IMPLANT
DERMABOND ADVANCED (GAUZE/BANDAGES/DRESSINGS) ×2
DERMABOND ADVANCED .7 DNX12 (GAUZE/BANDAGES/DRESSINGS) IMPLANT
DRSG COVADERM PLUS 2X2 (GAUZE/BANDAGES/DRESSINGS) ×1 IMPLANT
FORCEPS CUTTING 33CM 5MM (CUTTING FORCEPS) IMPLANT
FORCEPS CUTTING 45CM 5MM (CUTTING FORCEPS) IMPLANT
GLOVE BIO SURGEON STRL SZ7 (GLOVE) ×4 IMPLANT
GLOVE BIOGEL PI IND STRL 7.5 (GLOVE) IMPLANT
GLOVE BIOGEL PI INDICATOR 7.5 (GLOVE) ×1
GLOVE SURG SS PI 7.5 STRL IVOR (GLOVE) ×2 IMPLANT
GOWN PREVENTION PLUS LG XLONG (DISPOSABLE) ×5 IMPLANT
NDL SPNL 22GX3.5 QUINCKE BK (NEEDLE) IMPLANT
NEEDLE HYPO 22GX1.5 SAFETY (NEEDLE) IMPLANT
NEEDLE SPNL 22GX3.5 QUINCKE BK (NEEDLE) ×2 IMPLANT
NS IRRIG 1000ML POUR BTL (IV SOLUTION) ×2 IMPLANT
PACK LAPAROSCOPY BASIN (CUSTOM PROCEDURE TRAY) ×2 IMPLANT
POUCH SPECIMEN RETRIEVAL 10MM (ENDOMECHANICALS) ×1 IMPLANT
PROTECTOR NERVE ULNAR (MISCELLANEOUS) ×2 IMPLANT
SET IRRIG TUBING LAPAROSCOPIC (IRRIGATION / IRRIGATOR) ×1 IMPLANT
SOLUTION ANTI FOG 6CC (MISCELLANEOUS) ×1 IMPLANT
STRIP CLOSURE SKIN 1/4X4 (GAUZE/BANDAGES/DRESSINGS) ×1 IMPLANT
SUT VIC AB 3-0 PS2 18 (SUTURE) ×2
SUT VIC AB 3-0 PS2 18XBRD (SUTURE) ×1 IMPLANT
SUT VICRYL 0 UR6 27IN ABS (SUTURE) ×4 IMPLANT
TOWEL OR 17X24 6PK STRL BLUE (TOWEL DISPOSABLE) ×4 IMPLANT
TROCAR BALLN 12MMX100 BLUNT (TROCAR) ×2 IMPLANT
TROCAR Z-THREAD BLADED 5X100MM (TROCAR) ×4 IMPLANT
WARMER LAPAROSCOPE (MISCELLANEOUS) ×2 IMPLANT
WATER STERILE IRR 1000ML POUR (IV SOLUTION) ×2 IMPLANT

## 2012-01-14 NOTE — H&P (Signed)
Alicia Johnson is an 36 y.o. female presents with increased abdominal pain.  [id number] s/p methotrexate for a known ectopic.  Pt received methotrexate on 11/15.  Quants have been dropping appropriately.  Last quant 2 days ago was 31.  Today she awoke with severe abdominal pain, nausea, pain with bowel movements and urinating.  An ultrasound shows a complex adnexal mass on the left with some free fluid noted.  Her Hgb is 11.7 and she is hemodynamically stable    Past Medical History  Diagnosis Date  . Ectopic pregnancy   . No pertinent past medical history     Past Surgical History  Procedure Date  . Dilation and curettage of uterus   . Ectopic pregnancy surgery     Family History  Problem Relation Age of Onset  . Breast cancer Mother 43  . Breast cancer Maternal Aunt 49    tested negative for BRCAPlus  . Colon cancer Maternal Grandfather 32  . Other Neg Hx     Social History:  reports that she has never smoked. She does not have any smokeless tobacco history on file. She reports that she does not drink alcohol or use illicit drugs.  Allergies: No Known Allergies  Prescriptions prior to admission  Medication Sig Dispense Refill  . ibuprofen (ADVIL,MOTRIN) 200 MG tablet Take 400 mg by mouth every 6 (six) hours as needed. pain        ROS: as above  Blood pressure 128/96, pulse 71, temperature 97.8 F (36.6 C), temperature source Oral, resp. rate 20, height 5\' 5"  (1.651 m), weight 87.091 kg (192 lb), last menstrual period 01/10/2012, SpO2 100.00%, unknown if currently breastfeeding. Physical Exam  AOx3,  Abd tender, NT  Results for orders placed during the hospital encounter of 01/14/12 (from the past 24 hour(s))  CBC     Status: Abnormal   Collection Time   01/14/12 11:11 AM      Component Value Range   WBC 6.0  4.0 - 10.5 K/uL   RBC 3.90  3.87 - 5.11 MIL/uL   Hemoglobin 11.7 (*) 12.0 - 15.0 g/dL   HCT 98.1 (*) 19.1 - 47.8 %   MCV 90.5  78.0 - 100.0 fL   MCH 30.0   26.0 - 34.0 pg   MCHC 33.1  30.0 - 36.0 g/dL   RDW 29.5  62.1 - 30.8 %   Platelets 313  150 - 400 K/uL  HCG, QUANTITATIVE, PREGNANCY     Status: Abnormal   Collection Time   01/14/12 11:11 AM      Component Value Range   hCG, Beta Chain, Quant, S 19 (*) <5 mIU/mL    US Ob Comp Less 14 Wks  01/14/2012  *RADIOLOGY REPORT*  Clinical Data: 36 year old female with pelvic pain, vaginal bleeding and positive pregnancy test.  The patient given methotrexate approximately 1 month ago for presumed ectopic pregnancy.  OBSTETRIC <14 WK [name] AND TRANSVAGINAL OB US  Technique:  Both transabdominal and transvaginal ultrasound examinations were performed for complete evaluation of the gestation as well as the maternal uterus, adnexal regions, and pelvic cul-de-sac.  Transvaginal technique was performed to assess early pregnancy.  Comparison:  None  Intrauterine gestational sac:  Not visualized  Maternal uterus/adnexae: A 10.7 x 5.8 x 4.7 cm complex left adnexal mass identified compatible with an ectopic pregnancy. A small amount of free pelvic fluid is present. The right ovary is normal.  IMPRESSION: 10.7 x 5.8 x 4.7 cm complex left adnexal mass  and no evidence of intrauterine pregnancy - compatible with ectopic pregnancy.  Small amount of free pelvic fluid.  Critical Value/emergent results were called by telephone at the time of interpretation on 01/14/2012 at 12:30 p.m. to Pamelia Hoit, who verbally acknowledged these results.   Original Report Authenticated By: Harmon Pier, M.D.    US Ob Transvaginal  01/14/2012  *RADIOLOGY REPORT*  Clinical Data: 36 year old female with pelvic pain, vaginal bleeding and positive pregnancy test.  The patient given methotrexate approximately 1 month ago for presumed ectopic pregnancy.  OBSTETRIC <14 WK [name] AND TRANSVAGINAL OB US  Technique:  Both transabdominal and transvaginal ultrasound examinations were performed for complete evaluation of the gestation as well as the maternal  uterus, adnexal regions, and pelvic cul-de-sac.  Transvaginal technique was performed to assess early pregnancy.  Comparison:  None  Intrauterine gestational sac:  Not visualized  Maternal uterus/adnexae: A 10.7 x 5.8 x 4.7 cm complex left adnexal mass identified compatible with an ectopic pregnancy. A small amount of free pelvic fluid is present. The right ovary is normal.  IMPRESSION: 10.7 x 5.8 x 4.7 cm complex left adnexal mass and no evidence of intrauterine pregnancy - compatible with ectopic pregnancy.  Small amount of free pelvic fluid.  Critical Value/emergent results were called by telephone at the time of interpretation on 01/14/2012 at 12:30 p.m. to Pamelia Hoit, who verbally acknowledged these results.   Original Report Authenticated By: Harmon Pier, M.D.     Assessment/Plan 1) Left ectopic with hemoperitoneum.  Management options reviewed. Given new onset of bleeding and pain will proceed to the OR for diagnostic laparoscopy.  R/B/A reviewed  Leisl Spurrier H. 01/14/2012, 1:03 PM

## 2012-01-14 NOTE — Brief Op Note (Signed)
01/14/2012  4:02 PM  PATIENT:  Alicia Johnson  36 y.o. female  PRE-OPERATIVE DIAGNOSIS:  Ruptured Ectopic Pregnancy  POST-OPERATIVE DIAGNOSIS:  Ruptured ectopic pregnancy  PROCEDURE:  Procedure(s) (LRB) with comments: LAPAROSCOPY OPERATIVE (N/A) - Left Salpingectomy  SURGEON:  Surgeon(s) and Role:    * Miguel Aschoff, MD Primary    * Waynard Reeds, MD - Assisting  ANESTHESIA:   general  EBL:  Total I/O In: 500 [I.V.:500] Out: 95 [Urine:70; Blood:25]  BLOOD ADMINISTERED:none  DRAINS: none   LOCAL MEDICATIONS USED:  LIDOCAINE   SPECIMEN:  Source of Specimen:  Fallopian tube with ectopic  DISPOSITION OF SPECIMEN:  PATHOLOGY  COUNTS:  YES  TOURNIQUET:  * No tourniquets in log *  DICTATION: .Other Dictation: Dictation Number [id number]  PLAN OF CARE: Admit for overnight observation  PATIENT DISPOSITION:  PACU - hemodynamically stable.   Delay start of Pharmacological VTE agent (>24hrs) due to surgical blood loss or risk of bleeding: PAS hose applied

## 2012-01-14 NOTE — MAU Note (Signed)
Patient states she had sudden onset of severe abdominal pain about one hour ago. Patient was treated with MTX and last BHCG level done in the office yesterday was 31. Patient states nausea started after the pain. Pain with urinating and BM. Had what she thought was her period that started 12-2 with slight brown discharge today.

## 2012-01-14 NOTE — MAU Provider Note (Signed)
History     CSN: [id number]  Arrival date and time: 01/14/12 1041   First Provider Initiated Contact with Patient 01/14/12 1108      Chief Complaint  Patient presents with  . Abdominal Pain  . Nausea   HPI Pt s/p MTX given on 11/13 for suspicious ectopic pregnancy- presented in the office with pregnancy and bleeding and abd pain- Quant 571 and ultrasound showing  No past medical history on file.  No past surgical history on file.  Family History  Problem Relation Age of Onset  . Breast cancer Mother 73  . Breast cancer Maternal Aunt 49    tested negative for BRCAPlus  . Colon cancer Maternal Grandfather 47    History  Substance Use Topics  . Smoking status: Never Smoker   . Smokeless tobacco: Not on file  . Alcohol Use: No    Allergies: No Known Allergies  Prescriptions prior to admission  Medication Sig Dispense Refill  . ibuprofen (ADVIL,MOTRIN) 200 MG tablet Take 400 mg by mouth every 6 (six) hours as needed. pain        ROS Physical Exam   Blood pressure 128/96, pulse 71, temperature 97.8 F (36.6 C), temperature source Oral, resp. rate 20, height 5\' 5"  (1.651 m), weight 192 lb (87.091 kg), last menstrual period 01/10/2012, SpO2 100.00%, unknown if currently breastfeeding.  Physical Exam  Vitals reviewed. Constitutional: She is oriented to person, place, and time. She appears well-developed and well-nourished.       Very uncomfortable appearing  HENT:  Head: Normocephalic.  Eyes: Pupils are equal, round, and reactive to light.  Cardiovascular: Normal rate.   Respiratory: Effort normal.  GI: Soft. Bowel sounds are normal. There is tenderness. There is guarding. There is no rebound.  Musculoskeletal: Normal range of motion.  Neurological: She is alert and oriented to person, place, and time.  Skin: Skin is warm and dry.  Psychiatric: She has a normal mood and affect.    MAU Course  Procedures Results for orders placed during the hospital encounter  of 01/14/12 (from the past 24 hour(s))  CBC     Status: Abnormal   Collection Time   01/14/12 11:11 AM      Component Value Range   WBC 6.0  4.0 - 10.5 K/uL   RBC 3.90  3.87 - 5.11 MIL/uL   Hemoglobin 11.7 (*) 12.0 - 15.0 g/dL   HCT 95.6 (*) 21.3 - 08.6 %   MCV 90.5  78.0 - 100.0 fL   MCH 30.0  26.0 - 34.0 pg   MCHC 33.1  30.0 - 36.0 g/dL   RDW 57.8  46.9 - 62.9 %   Platelets 313  150 - 400 K/uL  HCG, QUANTITATIVE, PREGNANCY     Status: Abnormal   Collection Time   01/14/12 11:11 AM      Component Value Range   hCG, Beta Chain, Quant, S 19 (*) <5 mIU/mL  call from radiologist stating that pt has a 10x5x5 cm mass on left adnexal compatible with ectopic pregnancy Dr. Waynard Reeds notified and will come see pt RADIOLOGY REPORT*  Clinical Data: 36 year old female with pelvic pain, vaginal  bleeding and positive pregnancy test. The patient given  methotrexate approximately 1 month ago for presumed ectopic  pregnancy.  OBSTETRIC <14 WK [name] AND TRANSVAGINAL OB US  Technique: Both transabdominal and transvaginal ultrasound  examinations were performed for complete evaluation of the  gestation as well as the maternal uterus, adnexal regions, and  pelvic  cul-de-sac. Transvaginal technique was performed to assess  early pregnancy.  Comparison: None  Intrauterine gestational sac: Not visualized  Maternal uterus/adnexae:  A 10.7 x 5.8 x 4.7 cm complex left adnexal mass identified  compatible with an ectopic pregnancy.  A small amount of free pelvic fluid is present.  The right ovary is normal.  IMPRESSION:  10.7 x 5.8 x 4.7 cm complex left adnexal mass and no evidence of  intrauterine pregnancy - compatible with ectopic pregnancy. Small  amount of free pelvic fluid.  Critical Value/emergent results were called by telephone at the  time of interpretation on 01/14/2012 at 12:30 p.m. to Pamelia Hoit, who verbally acknowledged these results.  Original Report Authenticated By: Harmon Pier,  M.D.             External Result Report     External Result Report      Dr. Waynard Reeds here to see pt  Assessment and Plan    Alicia Johnson 01/14/2012, 11:08 AM

## 2012-01-14 NOTE — Anesthesia Preprocedure Evaluation (Addendum)
Anesthesia Evaluation  Patient identified by MRN, date of birth, ID band Patient awake    Reviewed: Allergy & Precautions, H&P , NPO status , Patient's Chart, lab work & pertinent test results  Airway Mallampati: III TM Distance: >3 FB Neck ROM: full    Dental No notable dental hx. (+) Teeth Intact   Pulmonary  breath sounds clear to auscultation  Pulmonary exam normal       Cardiovascular negative cardio ROS  Rhythm:regular Rate:Normal     Neuro/Psych  Neuromuscular disease    GI/Hepatic negative GI ROS,   Endo/Other    Renal/GU      Musculoskeletal   Abdominal Normal abdominal exam  (+)   Peds  Hematology negative hematology ROS (+)   Anesthesia Other Findings   Reproductive/Obstetrics                           Anesthesia Physical Anesthesia Plan  ASA: II and emergent  Anesthesia Plan: General ETT   Post-op Pain Management:    Induction:   Airway Management Planned:   Additional Equipment:   Intra-op Plan:   Post-operative Plan:   Informed Consent: I have reviewed the patients History and Physical, chart, labs and discussed the procedure including the risks, benefits and alternatives for the proposed anesthesia with the patient or authorized representative who has indicated his/her understanding and acceptance.   Dental Advisory Given  Plan Discussed with: CRNA, Surgeon and Anesthesiologist  Anesthesia Plan Comments:         Anesthesia Quick Evaluation

## 2012-01-14 NOTE — MAU Note (Signed)
Pt reports sudden sudden pain 1.5 hours ago. Feels like "something is sitting on it"

## 2012-01-14 NOTE — Transfer of Care (Signed)
Immediate Anesthesia Transfer of Care Note  Patient: Alicia Johnson  Procedure(s) Performed: Procedure(s) (LRB) with comments: LAPAROSCOPY OPERATIVE (N/A) - Left Salpingectomy  Patient Location: PACU  Anesthesia Type:General  Level of Consciousness: sedated  Airway & Oxygen Therapy: Patient Spontanous Breathing and Patient connected to nasal cannula oxygen  Post-op Assessment: Report given to PACU RN and Post -op Vital signs reviewed and stable  Post vital signs: stable  Complications: No apparent anesthesia complications

## 2012-01-14 NOTE — Anesthesia Procedure Notes (Signed)
Procedure Name: Intubation Performed by: Lior Cartelli D Pre-anesthesia Checklist: Patient identified, Emergency Drugs available, Suction available, Timeout performed and Patient being monitored Patient Re-evaluated:Patient Re-evaluated prior to inductionOxygen Delivery Method: Circle system utilized Preoxygenation: Pre-oxygenation with 100% oxygen Intubation Type: IV induction, Rapid sequence and Cricoid Pressure applied Laryngoscope Size: Mac and 3 Grade View: Grade I Tube type: Oral Tube size: 7.0 mm Number of attempts: 1 Airway Equipment and Method: Stylet Placement Confirmation: ETT inserted through vocal cords under direct vision,  breath sounds checked- equal and bilateral and positive ETCO2 Secured at: 22 cm Tube secured with: Tape Dental Injury: Teeth and Oropharynx as per pre-operative assessment

## 2012-01-15 LAB — CBC
MCH: 30.2 pg (ref 26.0–34.0)
MCHC: 33.3 g/dL (ref 30.0–36.0)
MCV: 90.5 fL (ref 78.0–100.0)
Platelets: 265 10*3/uL (ref 150–400)
RDW: 13.5 % (ref 11.5–15.5)

## 2012-01-15 MED ORDER — BUTORPHANOL TARTRATE 1 MG/ML IJ SOLN: 1.0000 mg | INTRAMUSCULAR | Status: DC | PRN

## 2012-01-15 NOTE — Plan of Care (Signed)
Problem: Phase II Progression Outcomes Goal: Progress activity as tolerated unless otherwise ordered Outcome: Completed/Met Date Met:  2012/02/13 Tolerates trips to the bathroom well Goal: Remove staples if indicated/incision care Outcome: Not Applicable Date Met:  2012/02/13 Skin glue Goal: Other Phase II Outcomes/Goals Outcome: Completed/Met Date Met:  February 13, 2012 Has passed gas

## 2012-01-15 NOTE — Anesthesia Postprocedure Evaluation (Signed)
  Anesthesia Post-op Note  Patient: Alicia Johnson  Procedure(s) Performed: Procedure(s) (LRB) with comments: LAPAROSCOPY OPERATIVE (N/A) - Left Salpingectomy  Patient Location: PACU and Women's Unit  Anesthesia Type:General  Level of Consciousness: awake, alert  and oriented  Airway and Oxygen Therapy: Patient Spontanous Breathing    Post-op Assessment: Patient's Cardiovascular Status Stable and Respiratory Function Stable  Post-op Vital Signs: stable  Complications: No apparent anesthesia complications

## 2012-01-15 NOTE — Progress Notes (Signed)
Discharge instructions reviewed with patient per Tia Alert, RN.  Patient states understanding of home care and signs/symptoms to report to MD.  Patient awaiting ride for discharge home.

## 2012-01-15 NOTE — Op Note (Signed)
NAMEVALEDA, CORZINE NO.:  [id number]  MEDICAL RECORD NO.:  [id number]  LOCATION:  9320                          FACILITY:  [facility]  PHYSICIAN:  Miguel Aschoff, M.D.       DATE OF BIRTH:  1975-09-29  DATE OF PROCEDURE:  01/14/2012 DATE OF DISCHARGE:                              OPERATIVE REPORT   PREOPERATIVE DIAGNOSIS:  Hemoperitoneum with left ectopic pregnancy.  POSTOPERATIVE DIAGNOSIS:  Hemoperitoneum with left ectopic pregnancy.  PROCEDURE:  Diagnostic laparoscopy, left salpingectomy, evacuation of hemoperitoneum.  SURGEON:  Miguel Aschoff, MD  ASSISTANT:  Freddrick March. Tenny Craw, MD  ANESTHESIA:  General.  COMPLICATIONS:  None.  JUSTIFICATION:  The patient is a 36 year old black female with a known ectopic pregnancy that had been treated with methotrexate and had significantly __________ values of HCG from 600 levels down to 33.  The patient was doing well until the morning of admission when she developed onset of severe abdominal pain, presented to the emergency room, and on evaluation with ultrasound was noted to have a 10 cm left adnexal mass consistent with ectopic pregnancy and hemoperitoneum.  Because of these findings, she was brought to the operating room to undergo laparoscopy. Informed consent had been obtained.  PROCEDURE IN DETAIL:  The patient was taken to the operating room, placed in supine position.  General anesthesia was administered without difficulty.  She was then placed in the dorsal lithotomy position, prepped and draped in usual sterile fashion.  The bladder was catheterized.  At this point, a speculum was placed in the vaginal vault.  The anterior cervical lip grasped with a tenaculum and then a Hulka tenaculum was placed through the cervix and held for manipulation of the uterus.  Once this was done, attention was directed to the abdomen where a small infraumbilical incision was made.  A Veress needle was inserted and then the abdomen was  insufflated with 3 L of CO2. Following the insufflation, the trocar to laparoscope was placed by the laparoscope itself.  On placing the laparoscope into the abdomen, it was noted there was a large volume of blood in the lower abdomen filling the pelvis and cul-de-sac.  At this point, 2 accessory ports of 5 mm were established in the right and left lower quadrants.  These were done under direct visualization.  Once this was done, a Nezhat suction irrigator unit was placed into the abdomen, and the blood was evacuated from the abdomen and pelvis.  Between 300 and 400 mL of blood and clots were removed.  Then, the pelvis was irrigated with saline and inspection at this point revealed a distal ectopic pregnancy with oozing coming from the distal tube.  The distal left ectopic was then elevated and then using the Kleppinger bipolar cautery forceps, the mesosalpinx was cauterized from the midportion of the tube to the distal end of the tube and then using laparoscopic scissors, this was excised.  Inspection was made for hemostasis.  Hemostasis appeared to be excellent.  At this point using 5 mm scope, the EndoCatch unit was placed through the operating channel, the ectopic pregnancy placed into the EndoCatch unit and removed through the umbilical incision.  At  this point with the procedure being completed with good hemostasis and with the abdomen irrigated of the blood, it was elected to complete the procedure.  The CO2 was allowed to escape as were all instruments.  Instrument and lap counts were taken and found to be correct.  The small incisions were then closed using subcuticular 3-0 Vicryl.  The port sites were then injected with a total of 7 mL of Xylocaine with epinephrine.  At this point, the patient reversed from anesthetic and taken to recovery room in satisfactory condition.  Plan is for the patient to be observed overnight.  It is expected that she will be discharged home on  January 15, 2012.     Miguel Aschoff, M.D.     AR/MEDQ  D:  01/14/2012  T:  01/14/2012  Job:  [id number]

## 2012-01-15 NOTE — Progress Notes (Signed)
S: Stable but still using PCA. Had partial left salpingectomy.  O: Afebrile  Pulse 101  BP Stable  Abdomen: soft wounds healing well  Hg. 8.6  A: Stable S/P left salpingectomy and hemoperitoneum. Now with post op anemia.  P: Observe today and see if she can ambulate without feeling weak. Will try to get off PCA.  May be able to be discharged home this PM or in AM 01/16/2012

## 2012-01-15 NOTE — Progress Notes (Signed)
Doing well. Has been stable throughout the day.  Afebrile  V/S stable  Abdomen soft non tender. Wounds healing well  Assessment: Stable S/P laparoscopic left salpingectomy for ectopic.  Plan:         D/C home       Return to office in four weeks       Take pain medication (Vicodin) as needed every 3 to 4 hours       Regular diet                  Nothing per vagina for 2 weeks                  Take iron once or twice per day                    Condition improved  Final DX: Left ampullary ectopic pregnancy with hemoperitoneum,.

## 2012-01-15 NOTE — Anesthesia Postprocedure Evaluation (Signed)
  Anesthesia Post-op Note  Patient: Alicia Johnson  Procedure(s) Performed: Procedure(s) (LRB) with comments: LAPAROSCOPY OPERATIVE (N/A) - Left Salpingectomy  Patient Location: PACU  Anesthesia Type:General  Level of Consciousness: awake, alert  and oriented  Airway and Oxygen Therapy: Patient Spontanous Breathing  Post-op Pain: none  Post-op Assessment: Post-op Vital signs reviewed, Patient's Cardiovascular Status Stable, Respiratory Function Stable, Patent Airway, No signs of Nausea or vomiting, Pain level controlled, No headache, No backache, No residual numbness and No residual motor weakness  Post-op Vital Signs: Reviewed and stable  Complications: No apparent anesthesia complications

## 2012-01-16 ENCOUNTER — Encounter (HOSPITAL_COMMUNITY): Payer: Self-pay | Admitting: Obstetrics and Gynecology

## 2012-01-16 LAB — TYPE AND SCREEN: Unit division: 0

## 2012-01-24 NOTE — Discharge Summary (Signed)
NAMEVUNG, KUSH NO.:  [id number]  MEDICAL RECORD NO.:  [id number]  LOCATION:  9320                          [facility]ACILITY:  [facility]  PHYSICIAN:  Miguel Aschoff, M.D.       DATE OF BIRTH:  May 19, 1975  DATE OF ADMISSION:  01/14/2012 DATE OF DISCHARGE:  01/15/2012                              DISCHARGE SUMMARY   ADMISSION DIAGNOSIS:  Hemoperitoneum and ectopic pregnancy.  FINAL DIAGNOSIS:  Hemoperitoneum and ectopic pregnancy.  OPERATIONS AND PROCEDURES:  Laparoscopy with left distal salpingectomy.  BRIEF HISTORY:  The patient is a 36 year old, black female, who had been previously diagnosed with an ectopic pregnancy on ultrasound.  The patient has been treated with methotrexate therapy and was noted to have an hCG value in the 600 range.  Since treatment of the methotrexate, her values fell down to a value of 33, and she was felt to be doing well with the prognosis for resolution of the ectopic pregnancy, very good. However, on January 14, 2012, she presented to the triage area at [facility] with severe lower abdominal pain.  Ultrasound at that point revealed what appeared to be a hemoperitoneum and a mass in the left lower quadrant.  Because of this, she was taken to the operating room, where under general anesthesia, laparoscopy was carried out.  At laparoscopy, the patient was found to have a hemoperitoneum.  The blood was evacuated out of the abdomen and pelvis via the laparoscope.  Once this was done, she was noted to have a distal left ectopic pregnancy which was bleeding.  At this point, a left distal salpingectomy was performed via the laparoscoped without difficulty.  The patient tolerated the surgical procedure well and was followed overnight on the [facility] through the cervix has remained stable.  There was no further evidence of bleeding.  Her hemoglobin did reach a nadir of 8.6. She was otherwise stable, and on December 8, was felt to be  in satisfactory condition and will be discharged home.  DISCHARGE MEDICATIONS:  Medications for home include ferrous sulfate 325 mg 1 p.o. t.i.d. and Vicodin 1 every 3-4 hours as needed for pain.  DISCHARGE INSTRUCTIONS:  She was instructed to place nothing in vagina, to call if there were any problems such as fever, pain, or heavy bleeding.  She was sent home in satisfactory condition on a regular diet.  Her pathology report revealed a dilated hemorrhagic fallopian tube with chorionic villi consistent with clinically an ectopic pregnancy.     Miguel Aschoff, M.D.     AR/MEDQ  D:  01/23/2012  T:  01/24/2012  Job:  [id number]

## 2012-03-26 ENCOUNTER — Encounter: Payer: Self-pay | Admitting: Genetic Counselor

## 2012-03-26 ENCOUNTER — Telehealth: Payer: Self-pay | Admitting: Genetic Counselor

## 2012-03-26 NOTE — Telephone Encounter (Signed)
Left message on VM.  No name on VM, so simply stated that Alicia Johnson was calling.

## 2012-04-09 ENCOUNTER — Other Ambulatory Visit (HOSPITAL_COMMUNITY): Payer: Self-pay | Admitting: Obstetrics and Gynecology

## 2012-04-09 DIAGNOSIS — N979 Female infertility, unspecified: Secondary | ICD-10-CM

## 2012-04-13 ENCOUNTER — Ambulatory Visit (HOSPITAL_COMMUNITY)
Admission: RE | Admit: 2012-04-13 | Discharge: 2012-04-13 | Disposition: A | Discharge status: Home / Self Care | Payer: BC Managed Care – PPO | Source: Ambulatory Visit | Attending: Obstetrics and Gynecology | Admitting: Obstetrics and Gynecology

## 2012-04-13 DIAGNOSIS — N979 Female infertility, unspecified: Secondary | ICD-10-CM | POA: Insufficient documentation

## 2012-04-13 MED ORDER — IOHEXOL 300 MG/ML  SOLN
20.0000 mL | Freq: Once | INTRAMUSCULAR | Status: AC | PRN
  Administered 2012-04-13: 3 mL

## 2012-05-16 ENCOUNTER — Inpatient Hospital Stay (HOSPITAL_COMMUNITY): Payer: BC Managed Care – PPO

## 2012-05-16 ENCOUNTER — Inpatient Hospital Stay (HOSPITAL_COMMUNITY)
Admission: AD | Admit: 2012-05-16 | Discharge: 2012-05-16 | Disposition: A | Discharge status: Home / Self Care | Payer: BC Managed Care – PPO | Source: Ambulatory Visit | Attending: Obstetrics and Gynecology | Admitting: Obstetrics and Gynecology

## 2012-05-16 ENCOUNTER — Encounter (HOSPITAL_COMMUNITY): Payer: Self-pay

## 2012-05-16 DIAGNOSIS — O009 Unspecified ectopic pregnancy without intrauterine pregnancy: Secondary | ICD-10-CM

## 2012-05-16 DIAGNOSIS — O00109 Unspecified tubal pregnancy without intrauterine pregnancy: Secondary | ICD-10-CM | POA: Insufficient documentation

## 2012-05-16 DIAGNOSIS — R109 Unspecified abdominal pain: Secondary | ICD-10-CM | POA: Insufficient documentation

## 2012-05-16 HISTORY — DX: Migraine, unspecified, not intractable, without status migrainosus: G43.909

## 2012-05-16 LAB — URINALYSIS, ROUTINE W REFLEX MICROSCOPIC
Ketones, ur: NEGATIVE mg/dL
Leukocytes, UA: NEGATIVE
Nitrite: NEGATIVE
Protein, ur: NEGATIVE mg/dL
Urobilinogen, UA: 0.2 mg/dL (ref 0.0–1.0)

## 2012-05-16 LAB — URINE MICROSCOPIC-ADD ON

## 2012-05-16 LAB — CBC WITH DIFFERENTIAL/PLATELET
Eosinophils Absolute: 0.1 10*3/uL (ref 0.0–0.7)
Eosinophils Relative: 1 % (ref 0–5)
HCT: 34.6 % — ABNORMAL LOW (ref 36.0–46.0)
Hemoglobin: 11.9 g/dL — ABNORMAL LOW (ref 12.0–15.0)
Lymphocytes Relative: 40 % (ref 12–46)
Lymphs Abs: 2.4 10*3/uL (ref 0.7–4.0)
MCH: 29.7 pg (ref 26.0–34.0)
MCV: 86.3 fL (ref 78.0–100.0)
Monocytes Absolute: 0.6 10*3/uL (ref 0.1–1.0)
Monocytes Relative: 10 % (ref 3–12)
Platelets: 272 10*3/uL (ref 150–400)
RBC: 4.01 MIL/uL (ref 3.87–5.11)
WBC: 6 10*3/uL (ref 4.0–10.5)

## 2012-05-16 LAB — POCT PREGNANCY, URINE: Preg Test, Ur: POSITIVE — AB

## 2012-05-16 LAB — COMPREHENSIVE METABOLIC PANEL
AST: 18 U/L (ref 0–37)
Albumin: 3.8 g/dL (ref 3.5–5.2)
BUN: 7 mg/dL (ref 6–23)
Chloride: 102 mEq/L (ref 96–112)
Creatinine, Ser: 0.65 mg/dL (ref 0.50–1.10)
Total Bilirubin: 0.2 mg/dL — ABNORMAL LOW (ref 0.3–1.2)
Total Protein: 7.3 g/dL (ref 6.0–8.3)

## 2012-05-16 MED ORDER — RHO D IMMUNE GLOBULIN 1500 UNIT/2ML IJ SOLN
300.0000 ug | Freq: Once | INTRAMUSCULAR | Status: AC
  Administered 2012-05-16: 300 ug via INTRAMUSCULAR
  Filled 2012-05-16: qty 2

## 2012-05-16 MED ORDER — METHOTREXATE INJECTION FOR WOMEN'S HOSPITAL
50.0000 mg/m2 | Freq: Once | INTRAMUSCULAR | Status: AC
  Administered 2012-05-16: 100 mg via INTRAMUSCULAR
  Filled 2012-05-16: qty 2

## 2012-05-16 NOTE — MAU Provider Note (Signed)
History     CSN: [id number]  Arrival date and time: 05/16/12 1749   None     Chief Complaint  Patient presents with  . Abdominal Pain   HPI Alicia Johnson is 37 y.o. G7P0060 [redacted]w[redacted]d weeks presenting with request for BHCG to be done today instead of outpatient for quicker results.  Spoke to Dr. Tenny Craw' office today and instructed to come here.  Hx of 2 miscarriages, 2 ectopicpregnancies with loss of left tube, 2 terminations.  This is a desired pregnancy and she is concerned with her history.  Denies vaginal bleeding.  Lower abdominal cramping and right groin pain that radiates up the abdomen.  Also having back pain and shoulder pain on the right.   LMP 04/07/12.  Has appt 4/24. Patient appears comfortable at this time.     Past Medical History  Diagnosis Date  . Ectopic pregnancy   . No pertinent past medical history   . Migraine     Past Surgical History  Procedure Laterality Date  . Dilation and curettage of uterus    . Ectopic pregnancy surgery    . Laparoscopy  01/14/2012    Procedure: LAPAROSCOPY OPERATIVE;  Surgeon: Freddrick March. Tenny Craw, MD;  Location: [facility];  Service: Gynecology;  Laterality: N/A;  Left Salpingectomy    Family History  Problem Relation Age of Onset  . Breast cancer Mother 34  . Breast cancer Maternal Aunt 49    tested negative for BRCAPlus  . Colon cancer Maternal Grandfather 84  . Other Neg Hx     History  Substance Use Topics  . Smoking status: Never Smoker   . Smokeless tobacco: Not on file  . Alcohol Use: No    Allergies: No Known Allergies  Prescriptions prior to admission  Medication Sig Dispense Refill  . ibuprofen (ADVIL,MOTRIN) 200 MG tablet Take 400 mg by mouth every 6 (six) hours as needed for pain.        Review of Systems  Constitutional: Negative for fever and chills.  Gastrointestinal: Positive for abdominal pain (lower right). Negative for nausea and vomiting.  Genitourinary:       Neg for vaginal bleeding  Musculoskeletal:  Positive for back pain (lower).       + for right sided shoulder pain  Neurological: Negative for headaches.   Physical Exam   Blood pressure 142/94, pulse 81, resp. rate 20, height 5\' 4"  (1.626 m), weight 202 lb 12.8 oz (91.989 kg), last menstrual period 04/07/2012, SpO2 100.00%.  Physical Exam  Constitutional: She is oriented to person, place, and time. She appears well-developed and well-nourished. No distress.  HENT:  Head: Normocephalic.  Neck: Normal range of motion.  Cardiovascular: Normal rate.   Respiratory: Effort normal.  GI: Soft. She exhibits no distension and no mass. There is tenderness (mild tenderness in the lower right quadrant). There is no rebound and no guarding.  Genitourinary: There is no rash, tenderness or lesion on the right labia. There is no rash, tenderness or lesion on the left labia. Uterus is not enlarged and not tender. Cervix exhibits no discharge and no friability. Right adnexum displays tenderness (mild). Right adnexum displays no mass and no fullness. Left adnexum displays tenderness (mild). Left adnexum displays no mass and no fullness. No erythema, tenderness or bleeding around the vagina. Vaginal discharge (scant white discharge without odor) found.  Neurological: She is alert and oriented to person, place, and time.  Skin: Skin is warm and dry.  Psychiatric: She has a  normal mood and affect. Her behavior is normal.   Results for orders placed during the hospital encounter of 05/16/12 (from the past 24 hour(s))  URINALYSIS, ROUTINE W REFLEX MICROSCOPIC     Status: Abnormal   Collection Time    05/16/12  6:10 PM      Result Value Range   Color, Urine YELLOW  YELLOW   APPearance CLEAR  CLEAR   Specific Gravity, Urine 1.025  1.005 - 1.030   pH 6.5  5.0 - 8.0   Glucose, UA NEGATIVE  NEGATIVE mg/dL   Hgb urine dipstick SMALL (*) NEGATIVE   Bilirubin Urine NEGATIVE  NEGATIVE   Ketones, ur NEGATIVE  NEGATIVE mg/dL   Protein, ur NEGATIVE  NEGATIVE  mg/dL   Urobilinogen, UA 0.2  0.0 - 1.0 mg/dL   Nitrite NEGATIVE  NEGATIVE   Leukocytes, UA NEGATIVE  NEGATIVE  URINE MICROSCOPIC-ADD ON     Status: Abnormal   Collection Time    05/16/12  6:10 PM      Result Value Range   Squamous Epithelial / LPF FEW (*) RARE   WBC, UA 0-2  <3 WBC/hpf   RBC / HPF 0-2  <3 RBC/hpf  POCT PREGNANCY, URINE     Status: Abnormal   Collection Time    05/16/12  6:15 PM      Result Value Range   Preg Test, Ur POSITIVE (*) NEGATIVE  HCG, QUANTITATIVE, PREGNANCY     Status: Abnormal   Collection Time    05/16/12  6:42 PM      Result Value Range   hCG, Beta Chain, Quant, S 1341 (*) <5 mIU/mL   BLOOD TYPE from previous record is O Negative Clinical Data: Right-sided pain. History of ectopic pregnancies.  OBSTETRIC <14 WK [name] AND TRANSVAGINAL OB US  Technique: Both transabdominal and transvaginal ultrasound  examinations were performed for complete evaluation of the  gestation as well as the maternal uterus, adnexal regions, and  pelvic cul-de-sac. Transvaginal technique was performed to assess  early pregnancy.  Comparison: None for this pregnancy.  Intrauterine gestational sac: None visualized.  Maternal uterus/adnexae:  At least three separate fibroids are present. The largest measures  2.8 x 2.4 x 2.9 cm.  The left ovary is of normal size and echotexture, measuring 3.8 x  2.7 x 3.1 cm. A complex cyst within the ovary measures 2.22 x 1.9  x 1.6 cm and may represent a corpus luteal cyst.  The right ovary is of normal size and echotexture, measuring 3.0 x  1.8 x 1.8 cm. Separate from the right ovary there is a ring-like  nodule measuring 1.1 x 1.0 x 2.4 cm. There is increased flow  surrounding this nodule. No significant free fluid is evident.  IMPRESSION:  1. No intrauterine pregnancy identified.  2. A 1 cm ring like nodular lesion adjacent to the right ovary is  highly suspicious for an ectopic pregnancy. No definite embryo is  seen.  3. Uterine  fibroids.  Critical Value/emergent results were called by telephone at the  time of interpretation on 05/16/2012 at 08:10 p.m. to [facility], who  verbally acknowledged these results.  Original Report Authenticated By: Marin Roberts, M.D.     MAU Course  Procedures  MDM 18:42 Reported MSE to Dr. Dareen Piano.  Order given for ultrasound. 20:21  Reported ultrasound findings of suspicious for right ectopic to Dr. Dareen Piano.  Order given for MTX and to f/u with Dr. Duane Lope.  I discussed at length the ultrasound findings and the plan of care ordered by Dr. Dareen Piano.  She agrees with the plan to treat with MTX.  Patient continues to be comfortable. 20:56  Care turned over to V. Katrinka Blazing, CNM Assessment and Plan  A: Right ectopic pregnancy  P: Call the office tomorrow and schedule follow up with Dr. Duane Lope.   KEY,EVE M 05/16/2012, 7:00 PM     05/16/12  6:42 PM      Result Value Range   hCG, Beta Chain, Quant, S 1341 (*) <5 mIU/mL  COMPREHENSIVE METABOLIC PANEL     Status: Abnormal   Collection Time    05/16/12  6:50 PM      Result Value Range   Sodium 135  135 - 145 mEq/L   Potassium 4.1  3.5 - 5.1 mEq/L   Chloride 102  96 - 112 mEq/L   CO2 22  19 - 32 mEq/L   Glucose, Bld 86  70 - 99 mg/dL   BUN 7  6 - 23 mg/dL   Creatinine, Ser 7.82  0.50 - 1.10 mg/dL   Calcium 9.2  8.4 - 95.6 mg/dL   Total Protein 7.3  6.0 - 8.3 g/dL   Albumin 3.8  3.5 - 5.2 g/dL   AST 18  0 - 37 U/L   ALT 8  0 - 35 U/L   Alkaline Phosphatase 53  39 - 117 U/L   Total Bilirubin 0.2 (*) 0.3 - 1.2 mg/dL   GFR calc non Af Amer >90  >90 mL/min   GFR calc Af Amer >90  >90 mL/min  CBC WITH DIFFERENTIAL     Status: Abnormal   Collection Time    05/16/12  6:50 PM      Result Value Range   WBC 6.0  4.0 - 10.5 K/uL   RBC 4.01  3.87 - 5.11 MIL/uL   Hemoglobin 11.9 (*) 12.0 - 15.0 g/dL   HCT 21.3 (*) 08.6 - 57.8 %   MCV 86.3  78.0 - 100.0 fL   MCH 29.7  26.0 - 34.0 pg   MCHC 34.4  30.0 - 36.0 g/dL    RDW 46.9  62.9 - 52.8 %   Platelets 272  150 - 400 K/uL   Neutrophils Relative 48  43 - 77 %   Neutro Abs 2.8  1.7 - 7.7 K/uL   Lymphocytes Relative 40  12 - 46 %   Lymphs Abs 2.4  0.7 - 4.0 K/uL   Monocytes Relative 10  3 - 12 %   Monocytes Absolute 0.6  0.1 - 1.0 K/uL   Eosinophils Relative 1  0 - 5 %   Eosinophils Absolute 0.1  0.0 - 0.7 K/uL   Basophils Relative 1  0 - 1 %   Basophils Absolute 0.0  0.0 - 0.1 K/uL  RH IG WORKUP (INCLUDES ABO/RH)     Status: None   Collection Time    05/16/12  6:50 PM      Result Value Range   Gestational Age(Wks) 5     ABO/RH(D) O NEG     Antibody Screen NEG     Unit Number [id number]     Blood Component Type RHIG     Unit division 00     Status of Unit ISSUED     Transfusion Status OK TO TRANSFUSE       2253: MTX and Rhophylac given. Pt anxious to leave prior to discussing F/U w/ CNM. Per  Dr. Dareen Piano RN instructed pt to call Dr. Donovan Kail tomorrow to schedule F/U blood work. Notified that she is due for Quant 05/21/12. Ectopic precautions.   [place], CNM 05/16/2012 11:07 PM

## 2012-05-16 NOTE — MAU Note (Signed)
Patient states she has a history of 2 ectopic pregnancies with MTX, then surgery for both. Has had multiple positive pregnancy tests at home. Has been having pain on the right lower abdomen. Denies bleeding.

## 2012-05-17 LAB — RH IG WORKUP (INCLUDES ABO/RH): Unit division: 0

## 2012-05-18 ENCOUNTER — Inpatient Hospital Stay (HOSPITAL_COMMUNITY)
Admission: AD | Admit: 2012-05-18 | Discharge: 2012-05-18 | Disposition: A | Discharge status: Home / Self Care | Payer: BC Managed Care – PPO | Source: Ambulatory Visit | Attending: Obstetrics and Gynecology | Admitting: Obstetrics and Gynecology

## 2012-05-18 DIAGNOSIS — O009 Unspecified ectopic pregnancy without intrauterine pregnancy: Secondary | ICD-10-CM

## 2012-05-18 DIAGNOSIS — O00109 Unspecified tubal pregnancy without intrauterine pregnancy: Secondary | ICD-10-CM | POA: Insufficient documentation

## 2012-05-18 LAB — HCG, QUANTITATIVE, PREGNANCY: hCG, Beta Chain, Quant, S: 1061 m[IU]/mL — ABNORMAL HIGH (ref <5)

## 2012-05-18 MED ORDER — TRAMADOL HCL 50 MG PO TABS: 50.0000 mg | ORAL_TABLET | Freq: Four times a day (QID) | ORAL | Status: DC | PRN

## 2012-05-18 NOTE — MAU Note (Signed)
Need lab work follow up for ectopic pregnancy

## 2012-05-18 NOTE — MAU Provider Note (Signed)
History   Chief Complaint:  Labs Only   Alicia Johnson is  37 y.o. G6P0050 Patient's last menstrual period was 04/07/2012.[name] Patient is here for follow up of quantitative HCG and ongoing surveillance of pregnancy status.   She is 3 days S/P MTX for ectopic pregnancy.     Since her last visit, the patient is with new complaint.   The patient reports bleeding as  spotting.  Mild cramping.  General ROS:  Otherwise neg  Her previous Quantitative HCG values are:  Results for Alicia, Johnson (MRN [id number]) as of 05/19/2012 04:39  Ref. Range 05/16/2012 18:42  hCG, Beta Chain, Quant, S Latest Range: <5 mIU/mL 1341 (H)   Physical Exam   Blood pressure 134/86, pulse 88, temperature 98.2 F (36.8 C), resp. rate 18, last menstrual period 04/07/2012.  Focused Gynecological Exam: examination not indicated  Labs: Results for orders placed during the hospital encounter of 05/18/12 (from the past 24 hour(s))  HCG, QUANTITATIVE, PREGNANCY   Collection Time    05/18/12  8:47 PM      Result Value Range   hCG, Beta Chain, Quant, S 1061 (*) <5 mIU/mL   Ultrasound Studies:   NA  Assessment: 3 days S/P MTX for ectopic pregnancy. Appropriate drop on quants.  Plan: D/C home per consult w/ Dr. Claiborne Billings.  Follow-up Information   Follow up with Philip Aspen, DO On 05/22/2012.   Contact information:   [address] [place] [address] [phone]       Follow up with [facility] [place] [facility] ADMISS[facility]. (As needed if symptoms worsen)    Contact information:   [address] [place] [address] [phone]       Medication List    TAKE these medications       traMADol 50 MG tablet  Commonly known as:  ULTRAM  Take 1 tablet (50 mg total) by mouth every 6 (six) hours as needed for pain.       Emphasized importance of weekly  Quants. Pt verbalizes understanding.   Alicia Johnson 05/18/2012, 10:03 PM

## 2012-05-18 NOTE — MAU Note (Signed)
[place] CNM in Triage to discuss lab results and follow-up plan with pt. Pt will follow up in office as directed

## 2012-05-20 NOTE — MAU Provider Note (Signed)
Attestation of Attending Supervision of Advanced Practitioner: Evaluation and management procedures were performed by the PA/NP/CNM/OB Fellow under my supervision/collaboration. Chart reviewed and agree with management and plan.  Javeion Cannedy V 05/20/2012 2:44 PM

## 2012-05-26 ENCOUNTER — Encounter (HOSPITAL_COMMUNITY): Payer: Self-pay | Admitting: Family

## 2012-05-26 ENCOUNTER — Inpatient Hospital Stay (HOSPITAL_COMMUNITY): Payer: BC Managed Care – PPO

## 2012-05-26 ENCOUNTER — Inpatient Hospital Stay (HOSPITAL_COMMUNITY)
Admission: AD | Admit: 2012-05-26 | Discharge: 2012-05-26 | Disposition: A | Discharge status: Home / Self Care | Payer: BC Managed Care – PPO | Source: Ambulatory Visit | Attending: Obstetrics and Gynecology | Admitting: Obstetrics and Gynecology

## 2012-05-26 DIAGNOSIS — M545 Low back pain, unspecified: Secondary | ICD-10-CM | POA: Insufficient documentation

## 2012-05-26 DIAGNOSIS — O00109 Unspecified tubal pregnancy without intrauterine pregnancy: Secondary | ICD-10-CM

## 2012-05-26 LAB — HCG, QUANTITATIVE, PREGNANCY: hCG, Beta Chain, Quant, S: 173 m[IU]/mL — ABNORMAL HIGH (ref <5)

## 2012-05-26 LAB — CBC
Hemoglobin: 11.4 g/dL — ABNORMAL LOW (ref 12.0–15.0)
MCH: 29.2 pg (ref 26.0–34.0)
MCHC: 33 g/dL (ref 30.0–36.0)
RDW: 14.2 % (ref 11.5–15.5)

## 2012-05-26 LAB — AST: AST: 13 U/L (ref 0–37)

## 2012-05-26 LAB — CREATININE, SERUM
GFR calc Af Amer: >90 mL/min (ref >90)
GFR calc non Af Amer: >90 mL/min (ref >90)

## 2012-05-26 MED ORDER — METHOTREXATE INJECTION FOR WOMEN'S HOSPITAL
50.0000 mg/m2 | Freq: Once | INTRAMUSCULAR | Status: AC
  Administered 2012-05-26: 100 mg via INTRAMUSCULAR
  Filled 2012-05-26: qty 2

## 2012-05-26 NOTE — MAU Provider Note (Signed)
History     CSN: [id number]  Arrival date and time: 05/26/12 0947   None     Chief Complaint  Patient presents with  . Pelvic Pain   HPI 37 y.o. G6P0050 at [redacted]w[redacted]d s/p MTX treatment for ectopic pregnancy increased RLQ pain and r low back pain radiating down right leg. Spotting now, decreased from heavy bleeding on Thursday. Pt was diagnosed in [facility]U with ectopic pregnancy on 05/16/12. Ultrasound at that time showed 1 cm ring like nodule adjacent to right ovary, quant was 1341 at that time. MTX and rhogam were administered. Quant fell to 1061 on day 4/11, 4/15 - 400 (in office) and 4/17 "near 300" (in office). Pt states pain increased on Thursday in right groin, is now mild, but constant.    Past Medical History  Diagnosis Date  . Ectopic pregnancy   . No pertinent past medical history   . Migraine     Past Surgical History  Procedure Laterality Date  . Dilation and curettage of uterus    . Ectopic pregnancy surgery    . Laparoscopy  01/14/2012    Procedure: LAPAROSCOPY OPERATIVE;  Surgeon: Freddrick March. Tenny Craw, MD;  Location: [facility];  Service: Gynecology;  Laterality: N/A;  Left Salpingectomy    Family History  Problem Relation Age of Onset  . Breast cancer Mother 43  . Breast cancer Maternal Aunt 49    tested negative for BRCAPlus  . Colon cancer Maternal Grandfather 37  . Other Neg Hx     History  Substance Use Topics  . Smoking status: Never Smoker   . Smokeless tobacco: Not on file  . Alcohol Use: No    Allergies: No Known Allergies  No prescriptions prior to admission    Review of Systems  Constitutional: Negative.   Respiratory: Negative.   Cardiovascular: Negative.   Gastrointestinal: Positive for abdominal pain. Negative for nausea, vomiting, diarrhea and constipation.  Genitourinary: Negative for dysuria, urgency, frequency, hematuria and flank pain.       Positive vaginal bleeding   Musculoskeletal: Positive for back pain.  Neurological: Negative.    Psychiatric/Behavioral: Negative.    Physical Exam   Blood pressure 145/93, pulse 88, temperature 98.5 F (36.9 C), temperature source Oral, resp. rate 18, height 5\' 4"  (1.626 m), weight 202 lb 6.4 oz (91.808 kg), last menstrual period 04/07/2012.  Physical Exam  Nursing note and vitals reviewed. Constitutional: She is oriented to person, place, and time. She appears well-developed and well-nourished. No distress.  Cardiovascular: Normal rate.   Respiratory: Effort normal. No respiratory distress.  GI: Soft. She exhibits no distension and no mass. There is no tenderness. There is no rebound and no guarding.  Musculoskeletal: Normal range of motion.  Neurological: She is alert and oriented to person, place, and time.  Skin: Skin is warm and dry.  Psychiatric: She has a normal mood and affect.    MAU Course  Procedures Results for orders placed during the hospital encounter of 05/26/12 (from the past 24 hour(s))  HCG, QUANTITATIVE, PREGNANCY     Status: Abnormal   Collection Time    05/26/12 10:11 AM      Result Value Range   hCG, Beta Chain, Quant, S 173 (*) <5 mIU/mL  AST     Status: None   Collection Time    05/26/12 10:11 AM      Result Value Range   AST 13  0 - 37 U/L  BUN     Status: Abnormal  Collection Time    05/26/12 10:11 AM      Result Value Range   BUN 5 (*) 6 - 23 mg/dL  CREATININE, SERUM     Status: None   Collection Time    05/26/12 10:11 AM      Result Value Range   Creatinine, Ser 0.69  0.50 - 1.10 mg/dL   GFR calc non Af Amer >90  >90 mL/min   GFR calc Af Amer >90  >90 mL/min  CBC     Status: Abnormal   Collection Time    05/26/12 10:12 AM      Result Value Range   WBC 3.2 (*) 4.0 - 10.5 K/uL   RBC 3.91  3.87 - 5.11 MIL/uL   Hemoglobin 11.4 (*) 12.0 - 15.0 g/dL   HCT 82.9 (*) 56.2 - 13.0 %   MCV 88.2  78.0 - 100.0 fL   MCH 29.2  26.0 - 34.0 pg   MCHC 33.0  30.0 - 36.0 g/dL   RDW 86.5  78.4 - 69.6 %   Platelets 271  150 - 400 K/uL   US Ob  Comp Less 14 Wks  05/16/2012  *RADIOLOGY REPORT*  Clinical Data: Right-sided pain.  History of ectopic pregnancies.  OBSTETRIC <14 WK [name] AND TRANSVAGINAL OB US  Technique:  Both transabdominal and transvaginal ultrasound examinations were performed for complete evaluation of the gestation as well as the maternal uterus, adnexal regions, and pelvic cul-de-sac.  Transvaginal technique was performed to assess early pregnancy.  Comparison:  None for this pregnancy.  Intrauterine gestational sac:  None visualized.  Maternal uterus/adnexae: At least three separate fibroids are present.  The largest measures 2.8 x 2.4 x 2.9 cm.  The left ovary is of normal size and echotexture, measuring 3.8 x 2.7 x 3.1 cm.  A complex cyst within the ovary measures 2.22 x 1.9 x 1.6 cm and may represent a corpus luteal cyst.  The right ovary is of normal size and echotexture, measuring 3.0 x 1.8 x 1.8 cm.  Separate from the right ovary there is a ring-like nodule measuring 1.1 x 1.0 x 2.4 cm.  There is increased flow surrounding this nodule.  No significant free fluid is evident.  IMPRESSION:  1.  No intrauterine pregnancy identified. 2.  A 1 cm ring like nodular lesion adjacent to the right ovary is highly suspicious for an ectopic pregnancy.  No definite embryo is seen. 3.  Uterine fibroids.  Critical Value/emergent results were called by telephone at the time of interpretation on 05/16/2012 at 08:10 p.m. to [facility], who verbally acknowledged these results.   Original Report Authenticated By: Marin Roberts, M.D.    US Ob Transvaginal  05/26/2012  *RADIOLOGY REPORT*  Clinical Data: Known right adnexal ectopic pregnancy, beta HCG decreasing status post methotrexate, increased pain  TRANSVAGINAL OBSTETRIC US  Technique:  Transvaginal ultrasound was performed for complete evaluation of the gestation as well as the maternal uterus, adnexal regions, and pelvic cul-de-sac.  Comparison:  05/16/2012  Intrauterine gestational sac: Not  visualized  Maternal uterus/adnexae: Two subserosal uterine fibroids, measuring 2.8 x 2.8 x 2.2 cm and 1.9 x 0.9 x 1.3 cm.  Right ovary is within normal limits, measuring 1.2 x 2.9 x 1.9 cm.  9 x 9 x 10 mm ring-like lesion in the right adnexa, unchanged in size, without associated color Doppler flow.  Left ovary is within normal limits, measuring 2.0 x 3.4 x 1.8 cm, and is notable for a corpus luteal  cyst.  Trace free fluid.  IMPRESSION: No evidence of IUP.  Suspected 10 mm right adnexal ectopic pregnancy, unchanged.  Trace free fluid.   Original Report Authenticated By: Charline Bills, M.D.      Assessment and Plan  Right ectopic pregnancy ten days s/p MTX with new onset of pain - quant falling, unchanged lesion in right adnexa 2nd dose of MTX today, f/u quant on day 4 Precautions rev'd - return with worsening pain or bleeding  Kwali Wrinkle 05/26/2012, 2:53 PM

## 2012-05-26 NOTE — MAU Note (Signed)
Pt treated with MTX on 4/9 for ectopic pregnancy. Last BHCG on Friday around 300. Started haivng lower Right groin pain on Thursday got better. But now is a constant pain on her lower abd and back.Had some bleeding on Thursday and spotting now.

## 2012-05-26 NOTE — MAU Note (Signed)
Patient presents to MAU with c/o ectopic pregnancy with sharp pains starting this morning, with new presentation of low back pain radiating down R leg.  Reports MTX given on 4/9.

## 2012-05-29 ENCOUNTER — Inpatient Hospital Stay (HOSPITAL_COMMUNITY)
Admission: AD | Admit: 2012-05-29 | Discharge: 2012-05-29 | Disposition: A | Discharge status: Home / Self Care | Payer: BC Managed Care – PPO | Source: Ambulatory Visit | Attending: Obstetrics and Gynecology | Admitting: Obstetrics and Gynecology

## 2012-05-29 DIAGNOSIS — O00109 Unspecified tubal pregnancy without intrauterine pregnancy: Secondary | ICD-10-CM | POA: Insufficient documentation

## 2012-05-29 NOTE — MAU Provider Note (Signed)
History   Chief Complaint:  Follow-up   Alicia Johnson is  37 y.o. G6P0050 Patient's last menstrual period was 04/07/2012.[name] Patient is here for follow up of quantitative HCG and ongoing surveillance of pregnancy status.   She is day 4 MTX dose #2    Since her last visit, the patient is without new complaint.   The patient reports bleeding as  lighter than period.  Mild cramping.   General ROS:  negative  Her previous Quantitative HCG values are:  Results for EMORI, MUMME (MRN [id number]) as of 05/31/2012 07:57  Ref. Range 05/16/2012 18:42 05/18/2012 20:47 05/26/2012 10:11  hCG, Beta Chain, Quant, S Latest Range: <5 mIU/mL 1341 (H) 1061 (H) 173 (H)    Physical Exam   Blood pressure 122/67, pulse 81, temperature 98.6 F (37 C), temperature source Oral, resp. rate 16, last menstrual period 04/07/2012.  Focused Gynecological Exam: examination not indicated  Labs: Results for orders placed during the hospital encounter of 05/29/12 (from the past 24 hour(s))  HCG, QUANTITATIVE, PREGNANCY   Collection Time    05/29/12  5:52 PM      Result Value Range   hCG, Beta Chain, Quant, S 97 (*) <5 mIU/mL    Ultrasound Studies:   US Ob Comp Less 14 Wks  05/16/2012  *RADIOLOGY REPORT*  Clinical Data: Right-sided pain.  History of ectopic pregnancies.  OBSTETRIC <14 WK [name] AND TRANSVAGINAL OB US  Technique:  Both transabdominal and transvaginal ultrasound examinations were performed for complete evaluation of the gestation as well as the maternal uterus, adnexal regions, and pelvic cul-de-sac.  Transvaginal technique was performed to assess early pregnancy.  Comparison:  None for this pregnancy.  Intrauterine gestational sac:  None visualized.  Maternal uterus/adnexae: At least three separate fibroids are present.  The largest measures 2.8 x 2.4 x 2.9 cm.  The left ovary is of normal size and echotexture, measuring 3.8 x 2.7 x 3.1 cm.  A complex cyst within the ovary measures 2.22 x 1.9 x 1.6 cm and may  represent a corpus luteal cyst.  The right ovary is of normal size and echotexture, measuring 3.0 x 1.8 x 1.8 cm.  Separate from the right ovary there is a ring-like nodule measuring 1.1 x 1.0 x 2.4 cm.  There is increased flow surrounding this nodule.  No significant free fluid is evident.  IMPRESSION:  1.  No intrauterine pregnancy identified. 2.  A 1 cm ring like nodular lesion adjacent to the right ovary is highly suspicious for an ectopic pregnancy.  No definite embryo is seen. 3.  Uterine fibroids.  Critical Value/emergent results were called by telephone at the time of interpretation on 05/16/2012 at 08:10 p.m. to [facility], who verbally acknowledged these results.   Original Report Authenticated By: Marin Roberts, M.D.    US Ob Transvaginal  05/26/2012  *RADIOLOGY REPORT*  Clinical Data: Known right adnexal ectopic pregnancy, beta HCG decreasing status post methotrexate, increased pain  TRANSVAGINAL OBSTETRIC US  Technique:  Transvaginal ultrasound was performed for complete evaluation of the gestation as well as the maternal uterus, adnexal regions, and pelvic cul-de-sac.  Comparison:  05/16/2012  Intrauterine gestational sac: Not visualized  Maternal uterus/adnexae: Two subserosal uterine fibroids, measuring 2.8 x 2.8 x 2.2 cm and 1.9 x 0.9 x 1.3 cm.  Right ovary is within normal limits, measuring 1.2 x 2.9 x 1.9 cm.  9 x 9 x 10 mm ring-like lesion in the right adnexa, unchanged in size, without associated color  Doppler flow.  Left ovary is within normal limits, measuring 2.0 x 3.4 x 1.8 cm, and is notable for a corpus luteal cyst.  Trace free fluid.  IMPRESSION: No evidence of IUP.  Suspected 10 mm right adnexal ectopic pregnancy, unchanged.  Trace free fluid.   Original Report Authenticated By: Charline Bills, M.D.    US Ob Transvaginal  05/16/2012  *RADIOLOGY REPORT*  Clinical Data: Right-sided pain.  History of ectopic pregnancies.  OBSTETRIC <14 WK [name] AND TRANSVAGINAL OB US  Technique:   Both transabdominal and transvaginal ultrasound examinations were performed for complete evaluation of the gestation as well as the maternal uterus, adnexal regions, and pelvic cul-de-sac.  Transvaginal technique was performed to assess early pregnancy.  Comparison:  None for this pregnancy.  Intrauterine gestational sac:  None visualized.  Maternal uterus/adnexae: At least three separate fibroids are present.  The largest measures 2.8 x 2.4 x 2.9 cm.  The left ovary is of normal size and echotexture, measuring 3.8 x 2.7 x 3.1 cm.  A complex cyst within the ovary measures 2.22 x 1.9 x 1.6 cm and may represent a corpus luteal cyst.  The right ovary is of normal size and echotexture, measuring 3.0 x 1.8 x 1.8 cm.  Separate from the right ovary there is a ring-like nodule measuring 1.1 x 1.0 x 2.4 cm.  There is increased flow surrounding this nodule.  No significant free fluid is evident.  IMPRESSION:  1.  No intrauterine pregnancy identified. 2.  A 1 cm ring like nodular lesion adjacent to the right ovary is highly suspicious for an ectopic pregnancy.  No definite embryo is seen. 3.  Uterine fibroids.  Critical Value/emergent results were called by telephone at the time of interpretation on 05/16/2012 at 08:10 p.m. to [facility], who verbally acknowledged these results.   Original Report Authenticated By: Marin Roberts, M.D.     Assessment: Day 4 S/P MTX dose #2 w/ appropriately dropping quants.  Plan: D/C home in stable condition. Ectopic precautions.   Medication List    ASK your doctor about these medications       acetaminophen 500 MG tablet  Commonly known as:  TYLENOL  Take 500 mg by mouth every 6 (six) hours as needed for pain (pain).     traMADol 50 MG tablet  Commonly known as:  ULTRAM  Take 1 tablet (50 mg total) by mouth every 6 (six) hours as needed for pain.      F/U w/ Dr. Freda Jackson in two weeks or MAU PRN.  [name] 05/29/2012, 8:41 PM

## 2012-05-29 NOTE — MAU Note (Signed)
Patient to MAU for day 4 BHCG s/p MTX. Patient states she has some mild pain off and on and continues to have some bleeding but not as heavy as before.

## 2012-05-31 ENCOUNTER — Telehealth: Payer: Self-pay | Admitting: Genetic Counselor

## 2012-05-31 NOTE — Telephone Encounter (Signed)
Left message that we had updated information on her testing and to please call me back.

## 2012-06-07 ENCOUNTER — Other Ambulatory Visit: Payer: BC Managed Care – PPO

## 2012-06-08 ENCOUNTER — Other Ambulatory Visit: Payer: BC Managed Care – PPO

## 2012-06-08 DIAGNOSIS — O009 Unspecified ectopic pregnancy without intrauterine pregnancy: Secondary | ICD-10-CM

## 2012-06-08 LAB — HCG, QUANTITATIVE, PREGNANCY: hCG, Beta Chain, Quant, S: 35.2 m[IU]/mL

## 2012-06-11 ENCOUNTER — Other Ambulatory Visit: Payer: BC Managed Care – PPO

## 2012-06-13 ENCOUNTER — Encounter: Payer: Self-pay | Admitting: Genetic Counselor

## 2012-06-13 ENCOUNTER — Telehealth: Payer: Self-pay

## 2012-06-13 NOTE — Telephone Encounter (Signed)
Called pt and left message that we are calling to confirm that she has or will have an appt with the gyn that she has chosen to follow up on lab draw or she can come here on the 06/15/12 to get another lab draw.  If she could leave it on our nurse call back on what her preference would be.

## 2012-06-13 NOTE — Telephone Encounter (Signed)
Patient had blood work done on 5/2.  Called in to check quant levels.

## 2012-06-13 NOTE — Telephone Encounter (Signed)
Called pt and left message that I am calling with her result information. Please call the nurse voice mail and state whether it is ok to leave the detailed information on your voice mail. **Per chart review: BHCG has dropped but still requires weekly test until level is <5. Pt was seen by [name] CNM @ MAU and was supposed to have follow up appt this week w/Dr. Freda Jackson as they are her Gyn. Need to ask pt if she has made the appt. If so, she may prefer to have follow up labs in their office. Next BHCG needs to be drawn on 5/9.

## 2012-06-14 ENCOUNTER — Telehealth: Payer: Self-pay | Admitting: *Deleted

## 2012-06-14 NOTE — Telephone Encounter (Signed)
Pt left message stating that she received a call yesterday with information but wants to know the exact level or number result of her hormone test. I returned the call and left message as requested that the number was [phone].  She may call our office to schedule appt for next lab draw on 5/9 or 5/12 or let us know if she has scheduled appt w/Dr. Tenny Craw.

## 2012-06-19 ENCOUNTER — Other Ambulatory Visit: Payer: BC Managed Care – PPO

## 2012-06-19 DIAGNOSIS — O009 Unspecified ectopic pregnancy without intrauterine pregnancy: Secondary | ICD-10-CM

## 2012-06-19 LAB — HCG, QUANTITATIVE, PREGNANCY: hCG, Beta Chain, Quant, S: 10.9 m[IU]/mL

## 2012-06-20 ENCOUNTER — Telehealth: Payer: Self-pay | Admitting: *Deleted

## 2012-06-20 NOTE — Telephone Encounter (Signed)
Alicia Johnson left a message stating she does authorize [name] to leave any information needed regarding her bloodwork on her voicemail. Wants to have the number of her blood level.  Called [name] and left a message we are returning her call and her (bhcg) blood level is now 10.9 on 06/19/12 and needs another blood test on 06/26/12- please call back and let us know what time you can come in on the 20th between 8am and 1130 for a blood draw.  ( per provider notes needs weekly draws until <5)

## 2012-06-21 NOTE — Telephone Encounter (Signed)
Called Teffany and left another message we are returning your call and level is 10.9 and need another draw on 20th- please call and leave a message as to what time you can come for a lab draw

## 2012-06-25 ENCOUNTER — Encounter: Payer: Self-pay | Admitting: *Deleted

## 2012-06-25 NOTE — Telephone Encounter (Signed)
Called Adhya and left another message we are returning your call and this is the third time we have called. You do need another lab draw and it is due tomorrow - please call to confirm an appointment. Will also send letter.

## 2012-06-27 ENCOUNTER — Other Ambulatory Visit: Payer: BC Managed Care – PPO

## 2012-06-27 DIAGNOSIS — O009 Unspecified ectopic pregnancy without intrauterine pregnancy: Secondary | ICD-10-CM

## 2012-06-29 ENCOUNTER — Telehealth: Payer: Self-pay | Admitting: *Deleted

## 2012-06-29 NOTE — Telephone Encounter (Signed)
Patient left message stating that she would like her hcg results. Stated that its okay to leave a detailed message please leave the number in the results. Called patient and informed her of results.

## 2012-10-10 ENCOUNTER — Encounter: Payer: Self-pay | Admitting: *Deleted

## 2012-10-14 ENCOUNTER — Encounter (HOSPITAL_COMMUNITY): Payer: Self-pay | Admitting: Obstetrics and Gynecology

## 2012-10-14 ENCOUNTER — Inpatient Hospital Stay (HOSPITAL_COMMUNITY)
Admission: AD | Admit: 2012-10-14 | Discharge: 2012-10-14 | Disposition: A | Discharge status: Home / Self Care | Payer: BC Managed Care – PPO | Source: Ambulatory Visit | Attending: Obstetrics & Gynecology | Admitting: Obstetrics & Gynecology

## 2012-10-14 ENCOUNTER — Inpatient Hospital Stay (HOSPITAL_COMMUNITY): Payer: BC Managed Care – PPO

## 2012-10-14 DIAGNOSIS — R109 Unspecified abdominal pain: Secondary | ICD-10-CM | POA: Insufficient documentation

## 2012-10-14 DIAGNOSIS — N949 Unspecified condition associated with female genital organs and menstrual cycle: Secondary | ICD-10-CM | POA: Insufficient documentation

## 2012-10-14 DIAGNOSIS — O99891 Other specified diseases and conditions complicating pregnancy: Secondary | ICD-10-CM | POA: Insufficient documentation

## 2012-10-14 DIAGNOSIS — O9989 Other specified diseases and conditions complicating pregnancy, childbirth and the puerperium: Secondary | ICD-10-CM

## 2012-10-14 LAB — COMPREHENSIVE METABOLIC PANEL
ALT: 7 U/L (ref 0–35)
AST: 11 U/L (ref 0–37)
Albumin: 3.7 g/dL (ref 3.5–5.2)
Chloride: 103 mEq/L (ref 96–112)
Creatinine, Ser: 0.71 mg/dL (ref 0.50–1.10)
Sodium: 134 mEq/L — ABNORMAL LOW (ref 135–145)
Total Bilirubin: 0.3 mg/dL (ref 0.3–1.2)

## 2012-10-14 LAB — HCG, QUANTITATIVE, PREGNANCY: hCG, Beta Chain, Quant, S: 647 m[IU]/mL — ABNORMAL HIGH (ref <5)

## 2012-10-14 LAB — URINE MICROSCOPIC-ADD ON

## 2012-10-14 LAB — CBC
MCH: 29.9 pg (ref 26.0–34.0)
MCHC: 34.3 g/dL (ref 30.0–36.0)
MCV: 87.3 fL (ref 78.0–100.0)
Platelets: 273 10*3/uL (ref 150–400)
RDW: 13.3 % (ref 11.5–15.5)

## 2012-10-14 LAB — URINALYSIS, ROUTINE W REFLEX MICROSCOPIC
Leukocytes, UA: NEGATIVE
Protein, ur: NEGATIVE mg/dL
Specific Gravity, Urine: 1.025 (ref 1.005–1.030)
Urobilinogen, UA: 1 mg/dL (ref 0.0–1.0)

## 2012-10-14 LAB — WET PREP, GENITAL

## 2012-10-14 LAB — POCT PREGNANCY, URINE: Preg Test, Ur: POSITIVE — AB

## 2012-10-14 MED ORDER — RHO D IMMUNE GLOBULIN 1500 UNIT/2ML IJ SOLN
300.0000 ug | Freq: Once | INTRAMUSCULAR | Status: DC
  Filled 2012-10-14: qty 2

## 2012-10-14 MED ORDER — RHO D IMMUNE GLOBULIN 1500 UNIT/2ML IJ SOLN
300.0000 ug | Freq: Once | INTRAMUSCULAR | Status: AC
  Administered 2012-10-14: 300 ug via INTRAMUSCULAR
  Filled 2012-10-14: qty 2

## 2012-10-14 MED ORDER — METHOTREXATE INJECTION FOR WOMEN'S HOSPITAL: 50.0000 mg/m2 | Freq: Once | INTRAMUSCULAR | Status: DC

## 2012-10-14 NOTE — MAU Provider Note (Signed)
Chief Complaint: Vaginal Bleeding   First Provider Initiated Contact with Patient 10/14/12 1851     SUBJECTIVE HPI: Alicia Johnson is a 37 y.o. G7P0060 at 5 weeks by LMP who presents to maternity admissions reporting vaginal spotting, described as bright red yesterday and brown today with abdominal cramping.  Pregnancy test was positive at home 4 days ago.  Patient's last menstrual period was 09/07/2012.  She is sure of this date and has regular cycles.  She has hx of 3 previous ectopic pregnancies with left salpingectomy.  She denies vaginal itching/burning, urinary symptoms, h/a, dizziness, n/v, or fever/chills.     Past Medical History  Diagnosis Date  . Ectopic pregnancy   . No pertinent past medical history   . Migraine    Past Surgical History  Procedure Laterality Date  . Dilation and curettage of uterus    . Ectopic pregnancy surgery    . Laparoscopy  01/14/2012    Procedure: LAPAROSCOPY OPERATIVE;  Surgeon: Freddrick March. Tenny Craw, MD;  Location: [facility];  Service: Gynecology;  Laterality: N/A;  Left Salpingectomy  . Unilateral salpingectomy      left tube   History   Social History  . Marital Status: Single    Spouse Name: N/A    Number of Children: N/A  . Years of Education: N/A   Occupational History  . Not on file.   Social History Main Topics  . Smoking status: Never Smoker   . Smokeless tobacco: Not on file  . Alcohol Use: No  . Drug Use: No  . Sexual Activity: Yes   Other Topics Concern  . Not on file   Social History Narrative  . No narrative on file   No current facility-administered medications on file prior to encounter.   No current outpatient prescriptions on file prior to encounter.   No Known Allergies  ROS: Pertinent items in HPI  OBJECTIVE Blood pressure 129/85, pulse 90, temperature 98.2 F (36.8 C), temperature source Oral, resp. rate 16, height 5\' 6"  (1.676 m), weight 94.065 kg (207 lb 6 oz), last menstrual period 09/07/2012. GENERAL:  Well-developed, well-nourished female in no acute distress.  HEENT: Normocephalic HEART: normal rate RESP: normal effort ABDOMEN: Soft, non-tender EXTREMITIES: Nontender, no edema NEURO: Alert and oriented Pelvic exam: Cervix pink, visually closed, without lesion, scant white creamy discharge, no blood noted, vaginal walls and external genitalia normal Bimanual exam: Cervix 0/long/high, firm, anterior, neg CMT, uterus nontender, nonenlarged, adnexa with mild tenderness on right, no enlargement or mass bilaterally  LAB RESULTS Results for orders placed during the hospital encounter of 10/14/12 (from the past 24 hour(s))  URINALYSIS, ROUTINE W REFLEX MICROSCOPIC     Status: Abnormal   Collection Time    10/14/12  6:13 PM      Result Value Range   Color, Urine YELLOW  YELLOW   APPearance CLEAR  CLEAR   Specific Gravity, Urine 1.025  1.005 - 1.030   pH 6.5  5.0 - 8.0   Glucose, UA NEGATIVE  NEGATIVE mg/dL   Hgb urine dipstick SMALL (*) NEGATIVE   Bilirubin Urine NEGATIVE  NEGATIVE   Ketones, ur 15 (*) NEGATIVE mg/dL   Protein, ur NEGATIVE  NEGATIVE mg/dL   Urobilinogen, UA 1.0  0.0 - 1.0 mg/dL   Nitrite NEGATIVE  NEGATIVE   Leukocytes, UA NEGATIVE  NEGATIVE  URINE MICROSCOPIC-ADD ON     Status: Abnormal   Collection Time    10/14/12  6:13 PM  Result Value Range   Squamous Epithelial / LPF FEW (*) RARE   RBC / HPF 3-6  <3 RBC/hpf   Bacteria, UA RARE  RARE   Urine-Other MUCOUS PRESENT    POCT PREGNANCY, URINE     Status: Abnormal   Collection Time    10/14/12  6:15 PM      Result Value Range   Preg Test, Ur POSITIVE (*) NEGATIVE  WET PREP, GENITAL     Status: Abnormal   Collection Time    10/14/12  7:00 PM      Result Value Range   Yeast Wet Prep HPF POC MANY (*) NONE SEEN   Trich, Wet Prep NONE SEEN  NONE SEEN   Clue Cells Wet Prep HPF POC FEW (*) NONE SEEN   WBC, Wet Prep HPF POC FEW (*) NONE SEEN  CBC     Status: Abnormal   Collection Time    10/14/12  7:41 PM       Result Value Range   WBC 5.5  4.0 - 10.5 K/uL   RBC 4.01  3.87 - 5.11 MIL/uL   Hemoglobin 12.0  12.0 - 15.0 g/dL   HCT 16.1 (*) 09.6 - 04.5 %   MCV 87.3  78.0 - 100.0 fL   MCH 29.9  26.0 - 34.0 pg   MCHC 34.3  30.0 - 36.0 g/dL   RDW 40.9  81.1 - 91.4 %   Platelets 273  150 - 400 K/uL  HCG, QUANTITATIVE, PREGNANCY     Status: Abnormal   Collection Time    10/14/12  7:41 PM      Result Value Range   hCG, Beta Chain, Quant, S 647 (*) <5 mIU/mL  RH IG WORKUP (INCLUDES ABO/RH)     Status: None   Collection Time    10/14/12  7:41 PM      Result Value Range   Gestational Age(Wks) 5     ABO/RH(D) O NEG     Antibody Screen NEG     Unit Number [id number]     Blood Component Type RHIG     Unit division 00     Status of Unit ISSUED     Transfusion Status OK TO TRANSFUSE     US Ob Comp Less 14 Wks  10/14/2012   *RADIOLOGY REPORT*  Clinical Data: Right lower quadrant pain.  Bleeding and cramping. Quantitative beta HCG is 647.  Estimated gestational age by LMP is 5 weeks 2 days.  OBSTETRIC <14 WK [name] AND TRANSVAGINAL OB US  Technique:  Both transabdominal and transvaginal ultrasound examinations were performed for complete evaluation of the gestation as well as the maternal uterus, adnexal regions, and pelvic cul-de-sac.  Transvaginal technique was performed to assess early pregnancy.  Comparison:  05/26/2012  Intrauterine gestational sac:  No intrauterine pregnancy demonstrated. Yolk sac: No yolk sac visualized. Embryo: No fetal pole visualized. Cardiac Activity: No fetal cardiac activity visualized.  Maternal uterus/adnexae: The uterus is anteverted.  Heterogeneous myometrial echotexture with a circumscribed fibroid is demonstrated in the left uterus measuring about 2.2 cm and about 1.8 cm diameter, respectively. Endometrial stripe is thickened and heterogeneous, measuring about 1.8 cm.  The left ovary measures 4.2 x 3 x 3.1 cm.  Flow is demonstrated in the left ovary on color flow Doppler imaging.   Normal follicular cysts are demonstrated.  There is a complex appearing cyst with somewhat ovoid configuration, likely representing a corpus luteum.  The right ovary measures 2.8 x 2 x  2.3 cm.  Normal follicular changes are demonstrated. Flow is demonstrated in the right ovary on color flow Doppler imaging.  In the right adnexa adjacent to the right ovary, there are to nodular ring-shaped structures.  While these measures 8 mm diameter and the other measures about 1.2 cm diameter.  No significant flow is demonstrated in these lesions. One of the lesions appears similar to a previous ectopic pregnancy seen on the prior study of 05/26/2012.  Findings are suspicious for but not definitive for ectopic pregnancy.  There is a small amount of free fluid in the pelvis.  IMPRESSION:  1.  No intrauterine pregnancy is identified. 2.  Two nodular ring-shaped lesions are demonstrated in the right adnexa, one appears similar to the structures seen on the previous study.  Ectopic pregnancy should be excluded.  Small amount of free fluid in the pelvis. 3.Recommend follow-up serial quantitative beta HCG and short term ultrasound as clinically indicated. Consider MRI if findings remain indeterminate.  Results were discussed by telephone with Mitsuko Luera at 2139 hours on 10/14/2012.   Original Report Authenticated By: Burman Nieves, M.D.   US Ob Transvaginal  10/14/2012   *RADIOLOGY REPORT*  Clinical Data: Right lower quadrant pain.  Bleeding and cramping. Quantitative beta HCG is 647.  Estimated gestational age by LMP is 5 weeks 2 days.  OBSTETRIC <14 WK [name] AND TRANSVAGINAL OB US  Technique:  Both transabdominal and transvaginal ultrasound examinations were performed for complete evaluation of the gestation as well as the maternal uterus, adnexal regions, and pelvic cul-de-sac.  Transvaginal technique was performed to assess early pregnancy.  Comparison:  05/26/2012  Intrauterine gestational sac:  No intrauterine pregnancy  demonstrated. Yolk sac: No yolk sac visualized. Embryo: No fetal pole visualized. Cardiac Activity: No fetal cardiac activity visualized.  Maternal uterus/adnexae: The uterus is anteverted.  Heterogeneous myometrial echotexture with a circumscribed fibroid is demonstrated in the left uterus measuring about 2.2 cm and about 1.8 cm diameter, respectively. Endometrial stripe is thickened and heterogeneous, measuring about 1.8 cm.  The left ovary measures 4.2 x 3 x 3.1 cm.  Flow is demonstrated in the left ovary on color flow Doppler imaging.  Normal follicular cysts are demonstrated.  There is a complex appearing cyst with somewhat ovoid configuration, likely representing a corpus luteum.  The right ovary measures 2.8 x 2 x 2.3 cm.  Normal follicular changes are demonstrated. Flow is demonstrated in the right ovary on color flow Doppler imaging.  In the right adnexa adjacent to the right ovary, there are to nodular ring-shaped structures.  While these measures 8 mm diameter and the other measures about 1.2 cm diameter.  No significant flow is demonstrated in these lesions. One of the lesions appears similar to a previous ectopic pregnancy seen on the prior study of 05/26/2012.  Findings are suspicious for but not definitive for ectopic pregnancy.  There is a small amount of free fluid in the pelvis.  IMPRESSION:  1.  No intrauterine pregnancy is identified. 2.  Two nodular ring-shaped lesions are demonstrated in the right adnexa, one appears similar to the structures seen on the previous study.  Ectopic pregnancy should be excluded.  Small amount of free fluid in the pelvis. 3.Recommend follow-up serial quantitative beta HCG and short term ultrasound as clinically indicated. Consider MRI if findings remain indeterminate.  Results were discussed by telephone with Anuoluwapo Mefferd at 2139 hours on 10/14/2012.   Original Report Authenticated By: Burman Nieves, M.D.    2208; Spoke with Dr. Arlyce Dice,  will review chart and call  back with plan 2233: Plan: will have patient FU in the office on Tuesday for repeat HCG and ultrasound  CBC, quant hcg, rhogam workup pending Pt to U/S Report to Thressa Sheller, CNM  Sharen Counter Certified Nurse-Midwife 10/14/2012  9:59 PM  1. Pelvic pain complicating pregnancy, antepartum, first trimester    Ectopic precautions reviewed FU with the office on Tuesday

## 2012-10-14 NOTE — MAU Note (Signed)
Pt states LMP-09/07/2012, had positive upt at home on Wednesday. Here today for bleeding intermittently for past two days, cramping and pain on her r side (x 2?weeks). Hx of ectopic x3, had surgery and left tube was removed.

## 2012-10-14 NOTE — MAU Note (Signed)
Patient presents in early pregnancy (HCG < 1000 mIU).  Ultrasound suspicious but not diagnostic of right ectopic.  Her left tube was removed 01/2012 for ruptured ectopic.  Her vital signs are stable and Hb is 12.0.  Will repeat US and HCG in 48 hours to be sure she does not have an IUP before treating her as an ectopic pregnancy.

## 2012-10-15 LAB — GC/CHLAMYDIA PROBE AMP: GC Probe RNA: NEGATIVE

## 2012-10-15 LAB — RH IG WORKUP (INCLUDES ABO/RH): Gestational Age(Wks): 5

## 2012-10-19 ENCOUNTER — Inpatient Hospital Stay (HOSPITAL_COMMUNITY)
Admission: AD | Admit: 2012-10-19 | Discharge: 2012-10-19 | Disposition: A | Discharge status: Home / Self Care | Payer: BC Managed Care – PPO | Source: Ambulatory Visit | Attending: Obstetrics and Gynecology | Admitting: Obstetrics and Gynecology

## 2012-10-19 ENCOUNTER — Encounter (HOSPITAL_COMMUNITY): Payer: Self-pay | Admitting: *Deleted

## 2012-10-19 ENCOUNTER — Inpatient Hospital Stay (HOSPITAL_COMMUNITY): Payer: BC Managed Care – PPO

## 2012-10-19 DIAGNOSIS — D259 Leiomyoma of uterus, unspecified: Secondary | ICD-10-CM | POA: Insufficient documentation

## 2012-10-19 DIAGNOSIS — O00109 Unspecified tubal pregnancy without intrauterine pregnancy: Secondary | ICD-10-CM | POA: Insufficient documentation

## 2012-10-19 DIAGNOSIS — N9489 Other specified conditions associated with female genital organs and menstrual cycle: Secondary | ICD-10-CM | POA: Insufficient documentation

## 2012-10-19 DIAGNOSIS — N949 Unspecified condition associated with female genital organs and menstrual cycle: Secondary | ICD-10-CM | POA: Insufficient documentation

## 2012-10-19 LAB — CBC WITH DIFFERENTIAL/PLATELET
Eosinophils Relative: 1 % (ref 0–5)
HCT: 34.9 % — ABNORMAL LOW (ref 36.0–46.0)
Lymphocytes Relative: 34 % (ref 12–46)
Lymphs Abs: 1.7 10*3/uL (ref 0.7–4.0)
MCH: 29.8 pg (ref 26.0–34.0)
MCV: 87.3 fL (ref 78.0–100.0)
Monocytes Absolute: 0.5 10*3/uL (ref 0.1–1.0)
RBC: 4 MIL/uL (ref 3.87–5.11)
RDW: 13.4 % (ref 11.5–15.5)
WBC: 5.1 10*3/uL (ref 4.0–10.5)

## 2012-10-19 LAB — CREATININE, SERUM
GFR calc Af Amer: >90 mL/min (ref >90)
GFR calc non Af Amer: >90 mL/min (ref >90)

## 2012-10-19 MED ORDER — METHOTREXATE INJECTION FOR WOMEN'S HOSPITAL
50.0000 mg/m2 | Freq: Once | INTRAMUSCULAR | Status: AC
  Administered 2012-10-19: 105 mg via INTRAMUSCULAR
  Filled 2012-10-19: qty 2.1

## 2012-10-19 MED ORDER — OXYCODONE-ACETAMINOPHEN 5-325 MG PO TABS
2.0000 | ORAL_TABLET | Freq: Once | ORAL | Status: AC
  Administered 2012-10-19: 2 via ORAL
  Filled 2012-10-19: qty 2

## 2012-10-19 MED ORDER — OXYCODONE-ACETAMINOPHEN 5-325 MG PO TABS: 1.0000 | ORAL_TABLET | ORAL | Status: DC | PRN

## 2012-10-19 NOTE — MAU Provider Note (Signed)
History     CSN: [id number]  Arrival date and time: 10/19/12 1347   First Provider Initiated Contact with Patient 10/19/12 1422      Chief Complaint  Patient presents with  . Ectopic Pregnancy   HPI Alicia Johnson is 37 y.o. G7P0060 [redacted]w[redacted]d weeks presenting with known ectopic on the right that was diagnosed this week here and followed in the office.  She is a patient of Dr. Duane Lope'.  This is her 7th pregnancy, with 3 tubal pregnancy, last one ruptured.  She was seen in MAU on 9/7 with BHCG 647, O NEG BLOOD TYPE--given Rhophylac, and U/S that did not see IUP, 2 ring shaped nodules on the right, small amt of FF.   She states she had U/S yesterday in the office that showed no fluid or blood.  Has had increased pain today and now would like evaluation for consideration of MTX. Rates her pain as 10/10, looks comfortable but states she is fighting back tear.  Has not taken anything for the pain.  Dr. Dareen Piano is aware she is here.    Past Medical History  Diagnosis Date  . Ectopic pregnancy   . No pertinent past medical history   . Migraine     Past Surgical History  Procedure Laterality Date  . Dilation and curettage of uterus    . Ectopic pregnancy surgery    . Laparoscopy  01/14/2012    Procedure: LAPAROSCOPY OPERATIVE;  Surgeon: Freddrick March. Tenny Craw, MD;  Location: [facility];  Service: Gynecology;  Laterality: N/A;  Left Salpingectomy  . Unilateral salpingectomy      left tube    Family History  Problem Relation Age of Onset  . Breast cancer Mother 7  . Breast cancer Maternal Aunt 49    tested negative for BRCAPlus  . Colon cancer Maternal Grandfather 13  . Other Neg Hx     History  Substance Use Topics  . Smoking status: Never Smoker   . Smokeless tobacco: Not on file  . Alcohol Use: No    Allergies: No Known Allergies  No prescriptions prior to admission    Review of Systems  Constitutional: Negative for fever and chills.  Gastrointestinal: Positive for abdominal pain  (on the right lower quadrant).  Genitourinary:       Neg for bleeding  Neurological: Negative for headaches.   Physical Exam   Blood pressure 119/84, pulse 85, temperature 98.3 F (36.8 C), temperature source Oral, resp. rate 18, height 5\' 6"  (1.676 m), weight 95.074 kg (209 lb 9.6 oz), last menstrual period 09/07/2012.  Physical Exam  Constitutional: She is oriented to person, place, and time. She appears well-developed and well-nourished. No distress (appears comfortable).  HENT:  Head: Normocephalic.  Neck: Normal range of motion.  Cardiovascular: Normal rate.   Respiratory: Effort normal.  Neurological: She is alert and oriented to person, place, and time.  Skin: Skin is warm and dry.  Psychiatric: She has a normal mood and affect. Her behavior is normal.   Results for orders placed during the hospital encounter of 10/19/12 (from the past 24 hour(s))  HCG, QUANTITATIVE, PREGNANCY     Status: Abnormal   Collection Time    10/19/12  2:40 PM      Result Value Range   hCG, Beta Chain, Quant, S 1547 (*) <5 mIU/mL  CBC WITH DIFFERENTIAL     Status: Abnormal   Collection Time    10/19/12  2:40 PM  Result Value Range   WBC 5.1  4.0 - 10.5 K/uL   RBC 4.00  3.87 - 5.11 MIL/uL   Hemoglobin 11.9 (*) 12.0 - 15.0 g/dL   HCT 16.1 (*) 09.6 - 04.5 %   MCV 87.3  78.0 - 100.0 fL   MCH 29.8  26.0 - 34.0 pg   MCHC 34.1  30.0 - 36.0 g/dL   RDW 40.9  81.1 - 91.4 %   Platelets 261  150 - 400 K/uL   Neutrophils Relative % 54  43 - 77 %   Neutro Abs 2.7  1.7 - 7.7 K/uL   Lymphocytes Relative 34  12 - 46 %   Lymphs Abs 1.7  0.7 - 4.0 K/uL   Monocytes Relative 10  3 - 12 %   Monocytes Absolute 0.5  0.1 - 1.0 K/uL   Eosinophils Relative 1  0 - 5 %   Eosinophils Absolute 0.0  0.0 - 0.7 K/uL   Basophils Relative 1  0 - 1 %   Basophils Absolute 0.0  0.0 - 0.1 K/uL  AST     Status: None   Collection Time    10/19/12  2:40 PM      Result Value Range   AST 13  0 - 37 U/L  BUN     Status:  None   Collection Time    10/19/12  2:40 PM      Result Value Range   BUN 6  6 - 23 mg/dL  CREATININE, SERUM     Status: None   Collection Time    10/19/12  2:40 PM      Result Value Range   Creatinine, Ser 0.67  0.50 - 1.10 mg/dL   GFR calc non Af Amer >90  >90 mL/min   GFR calc Af Amer >90  >90 mL/min   US Ob Transvaginal  10/19/2012   CLINICAL DATA:  Pelvic pain. Fibroids. Inappropriately rising beta HCG levels.  EXAM: TRANSVAGINAL OB ULTRASOUND  TECHNIQUE: Transvaginal ultrasound was performed for complete evaluation of the gestation as well as the maternal uterus, adnexal regions, and pelvic cul-de-sac.  COMPARISON:  10/14/2012  FINDINGS: Findings: No gestational sac or other fluid collections seen within endometrium. Thickened endometrium measures 21 mm. Two posterior uterine fibroids are seen measuring 2.2 cm and 3.0 cm in maximum diameter.  Both ovaries are normal in appearance. Medially adjacent to the right ovary is a small mass with a hyperechoic ring and suggestion of a central gestational sac containing a yolk sac. This mass measures 1.8 x 1.3 x 1.3 cm and is increased in size since previous study. This is highly suspicious for an ectopic pregnancy. Trace amount of free fluid noted in pelvic cul-de-sac.  IMPRESSION: Increased size of 1.8 cm mass in right adnexa adjacent to the ovary, highly suspicious for ectopic pregnancy. Trace amount of free fluid.  Small uterine fibroids.  Critical Value/emergent results were called by telephone at the time of interpretation on 10/19/2012 at 4:14 PMto the patient's MAU nurse Victorino Dike, who verbally acknowledged these results.   Electronically Signed   By: Myles Rosenthal   On: 10/19/2012 16:17    MAU Course  Procedures None  MDM 14:27  Spoke to Dr. Dareen Piano, reported MSE>  Order given for ultrasound and repeat labs.  Consider MTX.    Care turned over to Venia Carbon, NP at 16:00 1430: spoke with Dr. Dareen Piano; pt to receive methotrexate  Pt  received Rhogam on 10/15/2012 Percocet 2 tabs  given in MAU  Assessment and Plan   A: Right adnexal mass  P: Methotrexate 105 mg IM X1 Discharge home Follow up with Dr. Charlott Rakes office on Monday, September 15 for beta Hcg level  Return to MAU for increased pain, worsening symptoms; ectopic precautions  RX: Percocet 5/325 mg PO Q4-6 hours as needed for pain Support given    Iona Hansen Rashema Seawright, NP 10/19/2012 6:22 PM   KEY,EVE M 10/19/2012, 4:05 PM

## 2012-10-19 NOTE — MAU Note (Signed)
Pt seen in MD office yesterday, aware of ectopic pregnancy, began having severe pain here today, wants to have MTX injection.

## 2012-10-22 ENCOUNTER — Inpatient Hospital Stay (HOSPITAL_COMMUNITY)
Admission: AD | Admit: 2012-10-22 | Discharge: 2012-10-22 | Disposition: A | Discharge status: Home / Self Care | Payer: BC Managed Care – PPO | Source: Ambulatory Visit | Attending: Obstetrics and Gynecology | Admitting: Obstetrics and Gynecology

## 2012-10-22 DIAGNOSIS — R109 Unspecified abdominal pain: Secondary | ICD-10-CM | POA: Insufficient documentation

## 2012-10-22 DIAGNOSIS — O00109 Unspecified tubal pregnancy without intrauterine pregnancy: Secondary | ICD-10-CM | POA: Insufficient documentation

## 2012-10-22 NOTE — MAU Note (Signed)
Patient to MAU s/p MTX injection on 9-12 for an ectopic. Was to follow up in the office today but when she called the office no one knew about her. Patient states she had a BM last night at 2200 that has caused her abdomen to start hurting. Has light amount of brown discharge.

## 2012-10-22 NOTE — MAU Provider Note (Signed)
HPI:   Ms. Alicia Johnson is a 37 y.o. female who presents to MAU for a follow up Beta Hcg level. Her Quant on 9/12: 1547. She is having some abdominal cramping; however it has improved since the 12th. She denies vaginal bleeding, has small amount of brown discharge. She tried call the office this morning to follow up, however did not receive a call back from the nurse with an appointment.    Objective:  GENERAL: Well-developed, well-nourished female in no acute distress.  HEENT: Normocephalic, atraumatic.   LUNGS: Effort normal HEART: Regular rate  SKIN: Warm, dry and without erythema PSYCH: Normal mood and affect  Blood pressure 120/81, pulse 86, temperature 98.4 F (36.9 C), resp. rate 20, last menstrual period 09/07/2012.  Results for orders placed during the hospital encounter of 10/22/12 (from the past 24 hour(s))  HCG, QUANTITATIVE, PREGNANCY     Status: Abnormal   Collection Time    10/22/12  5:00 PM      Result Value Range   hCG, Beta Chain, Quant, S 833 (*) <5 mIU/mL   Consulted with Dr. Tenny Craw regarding plan of care.   A: Appropriate decline in Beta Hcg following methotrexate   P: Discharge home Call Dr. Charlott Rakes office on Thursday 9/18 to schedule a follow up beta Hcg for that day. If unable to make appointment in the office on 9/18, return to MAU for a follow up Beta Hcg  Ectopic precautions discussed Pelvic rest discussed  Return to MAU as needed, if symptoms worsen.  Support given  Iona Hansen Irwin Toran, NP 10/22/2012 7:31 PM

## 2012-11-19 ENCOUNTER — Other Ambulatory Visit: Payer: Self-pay | Admitting: Obstetrics and Gynecology

## 2012-11-19 DIAGNOSIS — Z803 Family history of malignant neoplasm of breast: Secondary | ICD-10-CM

## 2012-11-19 DIAGNOSIS — Z1231 Encounter for screening mammogram for malignant neoplasm of breast: Secondary | ICD-10-CM

## 2013-05-01 ENCOUNTER — Encounter (INDEPENDENT_AMBULATORY_CARE_PROVIDER_SITE_OTHER): Payer: Self-pay | Admitting: Surgery

## 2013-05-27 NOTE — Telephone Encounter (Signed)
Please see Visit Info comments 

## 2013-08-24 ENCOUNTER — Encounter (HOSPITAL_COMMUNITY): Payer: Self-pay | Admitting: *Deleted

## 2013-11-20 ENCOUNTER — Encounter (HOSPITAL_COMMUNITY): Payer: Self-pay | Admitting: Emergency Medicine

## 2013-11-20 ENCOUNTER — Emergency Department (INDEPENDENT_AMBULATORY_CARE_PROVIDER_SITE_OTHER)
Admission: EM | Admit: 2013-11-20 | Discharge: 2013-11-20 | Disposition: A | Discharge status: Home / Self Care | Payer: 59 | Source: Home / Self Care | Attending: Emergency Medicine | Admitting: Emergency Medicine

## 2013-11-20 DIAGNOSIS — H1013 Acute atopic conjunctivitis, bilateral: Secondary | ICD-10-CM

## 2013-11-20 DIAGNOSIS — J309 Allergic rhinitis, unspecified: Secondary | ICD-10-CM

## 2013-11-20 MED ORDER — FLUTICASONE PROPIONATE 50 MCG/ACT NA SUSP: 2.0000 | Freq: Every day | NASAL | Status: DC

## 2013-11-20 MED ORDER — CETIRIZINE HCL 10 MG PO TABS: 10.0000 mg | ORAL_TABLET | Freq: Every day | ORAL | Status: DC

## 2013-11-20 MED ORDER — KETOTIFEN FUMARATE 0.025 % OP SOLN: 1.0000 [drp] | Freq: Two times a day (BID) | OPHTHALMIC | Status: DC

## 2013-11-20 NOTE — ED Provider Notes (Signed)
Medical screening examination/treatment/procedure(s) were performed by non-physician practitioner and as supervising physician I was immediately available for consultation/collaboration.  Philipp Deputy, M.D.  Harden Mo, MD 11/20/13 [phone]

## 2013-11-20 NOTE — ED Notes (Signed)
Concern for URI/allergy symptoms x 1 week, w red eyes (r>l) ear pain (l>r); NAD, using OTC medications w minimal relief

## 2013-11-20 NOTE — ED Provider Notes (Signed)
CSN: [id number]     Arrival date & time 11/20/13  8921 History   First MD Initiated Contact with Patient 11/20/13 [phone]     Chief Complaint  Patient presents with  . Allergic Rhinitis    (Consider location/radiation/quality/duration/timing/severity/associated sxs/prior Treatment) HPI Comments: Non-smoker No active health issues Works at [employer] PCP: none  Patient is a 38 y.o. female presenting with URI.  URI Presenting symptoms: congestion, cough, rhinorrhea and sore throat   Presenting symptoms: no ear pain, no facial pain, no fatigue and no fever   Presenting symptoms comment:  "throat irritation," post nasal drainage and itchy, watery, red eyes. Severity:  Moderate Onset quality:  Gradual Duration:  2 weeks Timing:  Constant Progression:  Unchanged Chronicity:  New Associated symptoms: sneezing   Associated symptoms: no arthralgias, no headaches, no myalgias, no neck pain, no sinus pain, no swollen glands and no wheezing   Risk factors: no diabetes mellitus, no immunosuppression, no recent illness, no recent travel and no sick contacts     Past Medical History  Diagnosis Date  . Ectopic pregnancy   . No pertinent past medical history   . Migraine    Past Surgical History  Procedure Laterality Date  . Dilation and curettage of uterus    . Ectopic pregnancy surgery    . Laparoscopy  01/14/2012    Procedure: LAPAROSCOPY OPERATIVE;  Surgeon: Farrel Gobble. Harrington Challenger, MD;  Location: [facility];  Service: Gynecology;  Laterality: N/A;  Left Salpingectomy  . Unilateral salpingectomy      left tube   Family History  Problem Relation Age of Onset  . Breast cancer Mother 31  . Breast cancer Maternal Aunt 49    tested negative for BRCAPlus  . Colon cancer Maternal Grandfather 64  . Other Neg Hx    History  Substance Use Topics  . Smoking status: Never Smoker   . Smokeless tobacco: Not on file  . Alcohol Use: No   OB History   Grav Para Term Preterm Abortions TAB SAB  Ect Mult Living   7    6 1 2 3        Review of Systems  Constitutional: Negative for fever, chills and fatigue.  HENT: Positive for congestion, postnasal drip, rhinorrhea, sneezing and sore throat. Negative for ear pain, mouth sores and trouble swallowing.   Eyes: Positive for discharge, redness and itching. Negative for photophobia, pain and visual disturbance.  Respiratory: Positive for cough. Negative for shortness of breath and wheezing.   Cardiovascular: Negative.   Gastrointestinal: Negative.   Musculoskeletal: Negative for arthralgias, myalgias and neck pain.  Skin: Negative.   Neurological: Negative for headaches.    Allergies  Review of patient's allergies indicates no known allergies.  Home Medications   Prior to Admission medications   Medication Sig Start Date End Date Taking? Authorizing Provider  cetirizine (ZYRTEC) 10 MG tablet Take 1 tablet (10 mg total) by mouth daily. 11/20/13   Audelia Hives Presson, PA  fluticasone (FLONASE) 50 MCG/ACT nasal spray Place 2 sprays into both nostrils daily. 11/20/13   Audelia Hives Presson, PA  ketotifen (ZADITOR) 0.025 % ophthalmic solution Place 1 drop into both eyes 2 (two) times daily. 11/20/13   Audelia Hives Presson, PA  oxyCODONE-acetaminophen (PERCOCET/ROXICET) 5-325 MG per tablet Take 1 tablet by mouth every 4 (four) hours as needed for pain (Every 4-6 hours PRN). 10/19/12   Darrelyn Hillock Rasch, NP   BP 147/77  Pulse 85  Temp(Src) 98.7  F (37.1 C) (Oral)  Resp 16  SpO2 98% Physical Exam  Nursing note and vitals reviewed. Constitutional: She appears well-developed and well-nourished. No distress.  HENT:  Head: Normocephalic and atraumatic.  Right Ear: Hearing, tympanic membrane, external ear and ear canal normal.  Left Ear: Hearing, tympanic membrane, external ear and ear canal normal.  Nose: Nose normal.  Mouth/Throat: Uvula is midline, oropharynx is clear and moist and mucous membranes are normal. No oral  lesions. No trismus in the jaw.  Eyes: EOM and lids are normal. Pupils are equal, round, and reactive to light. Right conjunctiva is injected. Left conjunctiva is injected.  Neck: Normal range of motion. Neck supple.  Cardiovascular: Normal rate, regular rhythm and normal heart sounds.   Pulmonary/Chest: Effort normal and breath sounds normal.  Musculoskeletal: Normal range of motion.  Lymphadenopathy:    She has no cervical adenopathy.  Skin: Skin is warm and dry. No rash noted.  Psychiatric: She has a normal mood and affect. Her behavior is normal.    ED Course  Procedures (including critical care time) Labs Review Labs Reviewed - No data to display  Imaging Review No results found.   MDM   1. Allergic rhinitis, unspecified allergic rhinitis type   2. Allergic conjunctivitis, bilateral   Zyrtec, Zaditor and Flonase as prescribed with PCP follow up if no improvement.     [place], [place] 11/20/13 [phone]

## 2013-11-20 NOTE — Progress Notes (Signed)
Medical screening examination/treatment/procedure(s) were performed by non-physician practitioner and as supervising physician I was immediately available for consultation/collaboration.  Philipp Deputy, M.D.

## 2013-11-20 NOTE — Discharge Instructions (Signed)
Allergic Conjunctivitis  The conjunctiva is a thin membrane that covers the visible white part of the eyeball and the underside of the eyelids. This membrane protects and lubricates the eye. The membrane has small blood vessels running through it that can normally be seen. When the conjunctiva becomes inflamed, the condition is called conjunctivitis. In response to the inflammation, the conjunctival blood vessels become swollen. The swelling results in redness in the normally white part of the eye.  The blood vessels of this membrane also react when a person has allergies and is then called allergic conjunctivitis. This condition usually lasts for as long as the allergy persists. Allergic conjunctivitis cannot be passed to another person (non-contagious). The likelihood of bacterial infection is great and the cause is not likely due to allergies if the inflamed eye has:  · A sticky discharge.  · Discharge or sticking together of the lids in the morning.  · Scaling or flaking of the eyelids where the eyelashes come out.  · Red swollen eyelids.  CAUSES   · Viruses.  · Irritants such as foreign bodies.  · Chemicals.  · General allergic reactions.  · Inflammation or serious diseases in the inside or the outside of the eye or the orbit (the boney cavity in which the eye sits) can cause a "red eye."  SYMPTOMS   · Eye redness.  · Tearing.  · Itchy eyes.  · Burning feeling in the eyes.  · Clear drainage from the eye.  · Allergic reaction due to pollens or ragweed sensitivity. Seasonal allergic conjunctivitis is frequent in the spring when pollens are in the air and in the fall.  DIAGNOSIS   This condition, in its many forms, is usually diagnosed based on the history and an ophthalmological exam. It usually involves both eyes. If your eyes react at the same time every year, allergies may be the cause. While most "red eyes" are due to allergy or an infection, the role of an eye (ophthalmological) exam is important. The exam  can rule out serious diseases of the eye or orbit.  TREATMENT   · Non-antibiotic eye drops, ointments, or medications by mouth may be prescribed if the ophthalmologist is sure the conjunctivitis is due to allergies alone.  · Over-the-counter drops and ointments for allergic symptoms should be used only after other causes of conjunctivitis have been ruled out, or as your caregiver suggests.  Medications by mouth are often prescribed if other allergy-related symptoms are present. If the ophthalmologist is sure that the conjunctivitis is due to allergies alone, treatment is normally limited to drops or ointments to reduce itching and burning.  HOME CARE INSTRUCTIONS   · Wash hands before and after applying drops or ointments, or touching the inflamed eye(s) or eyelids.  · Do not let the eye dropper tip or ointment tube touch the eyelid when putting medicine in your eye.  · Stop using your soft contact lenses and throw them away. Use a new pair of lenses when recovery is complete. You should run through sterilizing cycles at least three times before use after complete recovery if the old soft contact lenses are to be used. Hard contact lenses should be stopped. They need to be thoroughly sterilized before use after recovery.  · Itching and burning eyes due to allergies is often relieved by using a cool cloth applied to closed eye(s).  SEEK MEDICAL CARE IF:   · Your problems do not go away after two or three days of treatment.  ·   have extreme light sensitivity.  An oral temperature above 102 F (38.9 C) develops.  Pain in or around the eye or any other visual symptom develops. MAKE SURE YOU:   Understand these instructions.  Will watch your condition.  Will get help right away if you are not doing well or get worse. Document  Released: 04/16/2002 Document Revised: 04/18/2011 Document Reviewed: 03/12/2007 [facility] Patient Information 2015 [place], [place]. This information is not intended to replace advice given to you by your health care provider. Make sure you discuss any questions you have with your health care provider.  Allergic Rhinitis Allergic rhinitis is when the mucous membranes in the nose respond to allergens. Allergens are particles in the air that cause your body to have an allergic reaction. This causes you to release allergic antibodies. Through a chain of events, these eventually cause you to release histamine into the blood stream. Although meant to protect the body, it is this release of histamine that causes your discomfort, such as frequent sneezing, congestion, and an itchy, runny nose.  CAUSES  Seasonal allergic rhinitis (hay fever) is caused by pollen allergens that may come from grasses, trees, and weeds. Year-round allergic rhinitis (perennial allergic rhinitis) is caused by allergens such as house dust mites, pet dander, and mold spores.  SYMPTOMS   Nasal stuffiness (congestion).  Itchy, runny nose with sneezing and tearing of the eyes. DIAGNOSIS  Your health care provider can help you determine the allergen or allergens that trigger your symptoms. If you and your health care provider are unable to determine the allergen, skin or blood testing may be used. TREATMENT  Allergic rhinitis does not have a cure, but it can be controlled by:  Medicines and allergy shots (immunotherapy).  Avoiding the allergen. Hay fever may often be treated with antihistamines in pill or nasal spray forms. Antihistamines block the effects of histamine. There are over-the-counter medicines that may help with nasal congestion and swelling around the eyes. Check with your health care provider before taking or giving this medicine.  If avoiding the allergen or the medicine prescribed do not work, there are many new  medicines your health care provider can prescribe. Stronger medicine may be used if initial measures are ineffective. Desensitizing injections can be used if medicine and avoidance does not work. Desensitization is when a patient is given ongoing shots until the body becomes less sensitive to the allergen. Make sure you follow up with your health care provider if problems continue. HOME CARE INSTRUCTIONS It is not possible to completely avoid allergens, but you can reduce your symptoms by taking steps to limit your exposure to them. It helps to know exactly what you are allergic to so that you can avoid your specific triggers. SEEK MEDICAL CARE IF:   You have a fever.  You develop a cough that does not stop easily (persistent).  You have shortness of breath.  You start wheezing.  Symptoms interfere with normal daily activities. Document Released: 10/19/2000 Document Revised: 01/29/2013 Document Reviewed: 10/01/2012 [facility] Patient Information 2015 [place], [place]. This information is not intended to replace advice given to you by your health care provider. Make sure you discuss any questions you have with your health care provider.  Allergic Rhinitis Allergic rhinitis is when the mucous membranes in the nose respond to allergens. Allergens are particles in the air that cause your body to have an allergic reaction. This causes you to release allergic antibodies. Through a chain of events, these eventually cause you to  release histamine into the blood stream. Although meant to protect the body, it is this release of histamine that causes your discomfort, such as frequent sneezing, congestion, and an itchy, runny nose.  CAUSES  Seasonal allergic rhinitis (hay fever) is caused by pollen allergens that may come from grasses, trees, and weeds. Year-round allergic rhinitis (perennial allergic rhinitis) is caused by allergens such as house dust mites, pet dander, and mold spores.  SYMPTOMS   Nasal  stuffiness (congestion).  Itchy, runny nose with sneezing and tearing of the eyes. DIAGNOSIS  Your health care provider can help you determine the allergen or allergens that trigger your symptoms. If you and your health care provider are unable to determine the allergen, skin or blood testing may be used. TREATMENT  Allergic rhinitis does not have a cure, but it can be controlled by:  Medicines and allergy shots (immunotherapy).  Avoiding the allergen. Hay fever may often be treated with antihistamines in pill or nasal spray forms. Antihistamines block the effects of histamine. There are over-the-counter medicines that may help with nasal congestion and swelling around the eyes. Check with your health care provider before taking or giving this medicine.  If avoiding the allergen or the medicine prescribed do not work, there are many new medicines your health care provider can prescribe. Stronger medicine may be used if initial measures are ineffective. Desensitizing injections can be used if medicine and avoidance does not work. Desensitization is when a patient is given ongoing shots until the body becomes less sensitive to the allergen. Make sure you follow up with your health care provider if problems continue. HOME CARE INSTRUCTIONS It is not possible to completely avoid allergens, but you can reduce your symptoms by taking steps to limit your exposure to them. It helps to know exactly what you are allergic to so that you can avoid your specific triggers. SEEK MEDICAL CARE IF:   You have a fever.  You develop a cough that does not stop easily (persistent).  You have shortness of breath.  You start wheezing.  Symptoms interfere with normal daily activities. Document Released: 10/19/2000 Document Revised: 01/29/2013 Document Reviewed: 10/01/2012 [facility] Patient Information 2015 [place], [place]. This information is not intended to replace advice given to you by your health care provider.  Make sure you discuss any questions you have with your health care provider.

## 2013-11-20 NOTE — Progress Notes (Signed)
Patient returns to the clinic demanding a prescription for amoxicillin based upon the recommendation of her aunt. She asked that I speak with her in the lobby. I suggested to staff that she be brought to exam room to allow her some privacy rather that discuss her medical issues in the lobby. Patient was placed in exam room for discussion but upon entering to see patient she refused to end her cell phone call stating that she wants conversation to occur on speaker phone so that her family member(s) can hear details of conversation. I advised her that I would be glad to speak with her, but only her and not unknown members of her family. I waited until she was finished with her telephone conversation. She states that she paid for her visit and should be prescribed anything she requests because she paid for her visit. I explained to her that based upon her history, physical exam and vital signs, amoxicillin did not seem to be appropriate therapy. She did not like this answer and expressed this verbally. Attempts were made to educate patient about her condition and advised medical therapy and she walked out of the clinic in the middle of the conversation.

## 2013-12-09 ENCOUNTER — Encounter (HOSPITAL_COMMUNITY): Payer: Self-pay | Admitting: Emergency Medicine

## 2014-07-21 ENCOUNTER — Encounter (HOSPITAL_COMMUNITY): Payer: Self-pay | Admitting: Emergency Medicine

## 2014-07-21 ENCOUNTER — Emergency Department (HOSPITAL_COMMUNITY)
Admission: EM | Admit: 2014-07-21 | Discharge: 2014-07-21 | Disposition: A | Discharge status: Home / Self Care | Payer: 59 | Source: Home / Self Care | Attending: Family Medicine | Admitting: Family Medicine

## 2014-07-21 DIAGNOSIS — J0100 Acute maxillary sinusitis, unspecified: Secondary | ICD-10-CM | POA: Diagnosis not present

## 2014-07-21 MED ORDER — PREDNISONE 10 MG PO TABS: 30.0000 mg | ORAL_TABLET | Freq: Every day | ORAL | Status: DC

## 2014-07-21 MED ORDER — IPRATROPIUM BROMIDE 0.06 % NA SOLN: 2.0000 | Freq: Four times a day (QID) | NASAL | Status: DC

## 2014-07-21 NOTE — Discharge Instructions (Signed)
Thank you for coming in today. Take Zyrtec daily. Use Atrovent nasal spray. Take prednisone if not better. Return as needed.  Call or go to the emergency room if you get worse, have trouble breathing, have chest pains, or palpitations.   Sinusitis Sinusitis is redness, soreness, and inflammation of the paranasal sinuses. Paranasal sinuses are air pockets within the bones of your face (beneath the eyes, the middle of the forehead, or above the eyes). In healthy paranasal sinuses, mucus is able to drain out, and air is able to circulate through them by way of your nose. However, when your paranasal sinuses are inflamed, mucus and air can become trapped. This can allow bacteria and other germs to grow and cause infection. Sinusitis can develop quickly and last only a short time (acute) or continue over a long period (chronic). Sinusitis that lasts for more than 12 weeks is considered chronic.  CAUSES  Causes of sinusitis include:  Allergies.  Structural abnormalities, such as displacement of the cartilage that separates your nostrils (deviated septum), which can decrease the air flow through your nose and sinuses and affect sinus drainage.  Functional abnormalities, such as when the small hairs (cilia) that line your sinuses and help remove mucus do not work properly or are not present. SIGNS AND SYMPTOMS  Symptoms of acute and chronic sinusitis are the same. The primary symptoms are pain and pressure around the affected sinuses. Other symptoms include:  Upper toothache.  Earache.  Headache.  Bad breath.  Decreased sense of smell and taste.  A cough, which worsens when you are lying flat.  Fatigue.  Fever.  Thick drainage from your nose, which often is green and may contain pus (purulent).  Swelling and warmth over the affected sinuses. DIAGNOSIS  Your health care provider will perform a physical exam. During the exam, your health care provider may:  Look in your nose for  signs of abnormal growths in your nostrils (nasal polyps).  Tap over the affected sinus to check for signs of infection.  View the inside of your sinuses (endoscopy) using an imaging device that has a light attached (endoscope). If your health care provider suspects that you have chronic sinusitis, one or more of the following tests may be recommended:  Allergy tests.  Nasal culture. A sample of mucus is taken from your nose, sent to a lab, and screened for bacteria.  Nasal cytology. A sample of mucus is taken from your nose and examined by your health care provider to determine if your sinusitis is related to an allergy. TREATMENT  Most cases of acute sinusitis are related to a viral infection and will resolve on their own within 10 days. Sometimes medicines are prescribed to help relieve symptoms (pain medicine, decongestants, nasal steroid sprays, or saline sprays).  However, for sinusitis related to a bacterial infection, your health care provider will prescribe antibiotic medicines. These are medicines that will help kill the bacteria causing the infection.  Rarely, sinusitis is caused by a fungal infection. In theses cases, your health care provider will prescribe antifungal medicine. For some cases of chronic sinusitis, surgery is needed. Generally, these are cases in which sinusitis recurs more than 3 times per year, despite other treatments. HOME CARE INSTRUCTIONS   Drink plenty of water. Water helps thin the mucus so your sinuses can drain more easily.  Use a humidifier.  Inhale steam 3 to 4 times a day (for example, sit in the bathroom with the shower running).  Apply a warm,  moist washcloth to your face 3 to 4 times a day, or as directed by your health care provider.  Use saline nasal sprays to help moisten and clean your sinuses.  Take medicines only as directed by your health care provider.  If you were prescribed either an antibiotic or antifungal medicine, finish it all  even if you start to feel better. SEEK IMMEDIATE MEDICAL CARE IF:  You have increasing pain or severe headaches.  You have nausea, vomiting, or drowsiness.  You have swelling around your face.  You have vision problems.  You have a stiff neck.  You have difficulty breathing. MAKE SURE YOU:   Understand these instructions.  Will watch your condition.  Will get help right away if you are not doing well or get worse. Document Released: 01/24/2005 Document Revised: 06/10/2013 Document Reviewed: 02/08/2011 [facility] Patient Information 2015 [place], [place]. This information is not intended to replace advice given to you by your health care provider. Make sure you discuss any questions you have with your health care provider.

## 2014-07-21 NOTE — ED Provider Notes (Signed)
Alicia Johnson is a 39 y.o. female who presents to Urgent Care today for nasal congestion and runny nose or congestion facial pressure. Symptoms present for a week. No fevers or chills nausea vomiting or diarrhea. She's tried some over-the-counter medicines which have helped a bit. No vomiting or diarrhea.   Past Medical History  Diagnosis Date  . Ectopic pregnancy   . No pertinent past medical history   . Migraine    Past Surgical History  Procedure Laterality Date  . Dilation and curettage of uterus    . Ectopic pregnancy surgery    . Laparoscopy  01/14/2012    Procedure: LAPAROSCOPY OPERATIVE;  Surgeon: Farrel Gobble. Harrington Challenger, MD;  Location: [facility];  Service: Gynecology;  Laterality: N/A;  Left Salpingectomy  . Unilateral salpingectomy      left tube   History  Substance Use Topics  . Smoking status: Never Smoker   . Smokeless tobacco: Not on file  . Alcohol Use: No   ROS as above Medications: No current facility-administered medications for this encounter.   Current Outpatient Prescriptions  Medication Sig Dispense Refill  . cetirizine (ZYRTEC) 10 MG tablet Take 1 tablet (10 mg total) by mouth daily. 30 tablet 0  . ipratropium (ATROVENT) 0.06 % nasal spray Place 2 sprays into both nostrils 4 (four) times daily. 15 mL 1  . predniSONE (DELTASONE) 10 MG tablet Take 3 tablets (30 mg total) by mouth daily. 15 tablet 0  . [DISCONTINUED] fluticasone (FLONASE) 50 MCG/ACT nasal spray Place 2 sprays into both nostrils daily. 16 g 0   No Known Allergies   Exam:  BP 131/99 mmHg  Pulse 78  Temp(Src) 97.2 F (36.2 C) (Oral)  Resp 16  SpO2 99% Gen: Well NAD HEENT: EOMI,  MMM clear nasal discharge. Posterior pharynx with cobblestoning. Normal tympanic membranes bilaterally. Nontender maxillary and frontal sinuses. Lungs: Normal work of breathing. CTABL Heart: RRR no MRG Abd: NABS, Soft. Nondistended, Nontender Exts: Brisk capillary refill, warm and well perfused.   No results found  for this or any previous visit (from the past 24 hour(s)). No results found.  Assessment and Plan: 39 y.o. female with viral sinusitis. Treat with Atrovent nasal spray. Use prednisone if not better. Use Zyrtec.  Discussed warning signs or symptoms. Please see discharge instructions. Patient expresses understanding.     Gregor Hams, MD 07/21/14 1556

## 2014-07-21 NOTE — ED Notes (Signed)
C/o cold/sinus sx onset 1 week Sx include congestion, HA, bilateral ear fullness, wheezing Denies fevers, chills Taking OTC allergy meds w/no relief Alert, no signs of acute distress.

## 2016-05-16 ENCOUNTER — Other Ambulatory Visit: Payer: Self-pay | Admitting: Radiology

## 2016-05-16 DIAGNOSIS — Z803 Family history of malignant neoplasm of breast: Secondary | ICD-10-CM

## 2016-07-06 ENCOUNTER — Other Ambulatory Visit: Payer: 59

## 2016-07-15 ENCOUNTER — Other Ambulatory Visit: Payer: Self-pay | Admitting: Internal Medicine

## 2016-07-15 DIAGNOSIS — G8929 Other chronic pain: Secondary | ICD-10-CM

## 2016-07-15 DIAGNOSIS — M545 Low back pain, unspecified: Secondary | ICD-10-CM

## 2016-07-22 ENCOUNTER — Ambulatory Visit
Admission: RE | Admit: 2016-07-22 | Discharge: 2016-07-22 | Disposition: A | Discharge status: Home / Self Care | Payer: BLUE CROSS/BLUE SHIELD | Source: Ambulatory Visit | Attending: Radiology | Admitting: Radiology

## 2016-07-22 ENCOUNTER — Ambulatory Visit
Admission: RE | Admit: 2016-07-22 | Discharge: 2016-07-22 | Disposition: A | Discharge status: Home / Self Care | Payer: BLUE CROSS/BLUE SHIELD | Source: Ambulatory Visit | Attending: Internal Medicine | Admitting: Internal Medicine

## 2016-07-22 DIAGNOSIS — M545 Low back pain, unspecified: Secondary | ICD-10-CM

## 2016-07-22 DIAGNOSIS — G8929 Other chronic pain: Secondary | ICD-10-CM

## 2016-07-22 DIAGNOSIS — Z803 Family history of malignant neoplasm of breast: Secondary | ICD-10-CM

## 2016-07-22 MED ORDER — GADOBENATE DIMEGLUMINE 529 MG/ML IV SOLN
20.0000 mL | Freq: Once | INTRAVENOUS | Status: AC | PRN
  Administered 2016-07-22: 20 mL via INTRAVENOUS

## 2016-07-25 ENCOUNTER — Other Ambulatory Visit: Payer: Self-pay | Admitting: Radiology

## 2016-07-25 DIAGNOSIS — R928 Other abnormal and inconclusive findings on diagnostic imaging of breast: Secondary | ICD-10-CM

## 2016-08-09 ENCOUNTER — Ambulatory Visit
Admission: RE | Admit: 2016-08-09 | Discharge: 2016-08-09 | Disposition: A | Discharge status: Home / Self Care | Payer: BLUE CROSS/BLUE SHIELD | Source: Ambulatory Visit | Attending: Radiology | Admitting: Radiology

## 2016-08-09 ENCOUNTER — Other Ambulatory Visit: Payer: Self-pay | Admitting: Radiology

## 2016-08-09 DIAGNOSIS — R928 Other abnormal and inconclusive findings on diagnostic imaging of breast: Secondary | ICD-10-CM

## 2016-08-09 MED ORDER — GADOBENATE DIMEGLUMINE 529 MG/ML IV SOLN
20.0000 mL | Freq: Once | INTRAVENOUS | Status: AC | PRN
  Administered 2016-08-09: 20 mL via INTRAVENOUS

## 2016-11-30 ENCOUNTER — Emergency Department (HOSPITAL_COMMUNITY): Payer: BLUE CROSS/BLUE SHIELD

## 2016-11-30 ENCOUNTER — Encounter (HOSPITAL_COMMUNITY): Payer: Self-pay | Admitting: Emergency Medicine

## 2016-11-30 ENCOUNTER — Emergency Department (HOSPITAL_COMMUNITY)
Admission: EM | Admit: 2016-11-30 | Discharge: 2016-11-30 | Disposition: A | Discharge status: Home / Self Care | Payer: BLUE CROSS/BLUE SHIELD | Attending: Emergency Medicine | Admitting: Emergency Medicine

## 2016-11-30 DIAGNOSIS — R51 Headache: Secondary | ICD-10-CM | POA: Diagnosis not present

## 2016-11-30 DIAGNOSIS — R202 Paresthesia of skin: Secondary | ICD-10-CM | POA: Diagnosis not present

## 2016-11-30 DIAGNOSIS — I10 Essential (primary) hypertension: Secondary | ICD-10-CM | POA: Diagnosis not present

## 2016-11-30 DIAGNOSIS — R519 Headache, unspecified: Secondary | ICD-10-CM

## 2016-11-30 DIAGNOSIS — R112 Nausea with vomiting, unspecified: Secondary | ICD-10-CM | POA: Insufficient documentation

## 2016-11-30 DIAGNOSIS — Z8669 Personal history of other diseases of the nervous system and sense organs: Secondary | ICD-10-CM

## 2016-11-30 LAB — COMPREHENSIVE METABOLIC PANEL
ALT: 11 U/L — ABNORMAL LOW (ref 14–54)
AST: 16 U/L (ref 15–41)
Albumin: 4.5 g/dL (ref 3.5–5.0)
Alkaline Phosphatase: 49 U/L (ref 38–126)
Anion gap: 7 (ref 5–15)
BUN: 6 mg/dL (ref 6–20)
CHLORIDE: 108 mmol/L (ref 101–111)
CO2: 23 mmol/L (ref 22–32)
Calcium: 9.6 mg/dL (ref 8.9–10.3)
Creatinine, Ser: 0.86 mg/dL (ref 0.44–1.00)
GFR calc Af Amer: >60 mL/min (ref >60)
GFR calc non Af Amer: >60 mL/min (ref >60)
Glucose, Bld: 98 mg/dL (ref 65–99)
Potassium: 4 mmol/L (ref 3.5–5.1)
SODIUM: 138 mmol/L (ref 135–145)
Total Bilirubin: 0.7 mg/dL (ref 0.3–1.2)
Total Protein: 7.9 g/dL (ref 6.5–8.1)

## 2016-11-30 LAB — URINALYSIS, ROUTINE W REFLEX MICROSCOPIC
Bilirubin Urine: NEGATIVE
Glucose, UA: NEGATIVE mg/dL
KETONES UR: NEGATIVE mg/dL
LEUKOCYTES UA: NEGATIVE
Nitrite: NEGATIVE
Protein, ur: NEGATIVE mg/dL
Specific Gravity, Urine: 1.006 (ref 1.005–1.030)
pH: 7 (ref 5.0–8.0)

## 2016-11-30 LAB — CBC
HCT: 38.1 % (ref 36.0–46.0)
Hemoglobin: 12.7 g/dL (ref 12.0–15.0)
MCH: 29.5 pg (ref 26.0–34.0)
MCHC: 33.3 g/dL (ref 30.0–36.0)
MCV: 88.6 fL (ref 78.0–100.0)
Platelets: 221 10*3/uL (ref 150–400)
RBC: 4.3 MIL/uL (ref 3.87–5.11)
RDW: 13.4 % (ref 11.5–15.5)
WBC: 2.9 10*3/uL — AB (ref 4.0–10.5)

## 2016-11-30 LAB — DIFFERENTIAL
Basophils Absolute: 0 10*3/uL (ref 0.0–0.1)
Basophils Relative: 1 %
Eosinophils Absolute: 0 10*3/uL (ref 0.0–0.7)
Eosinophils Relative: 1 %
LYMPHS ABS: 1 10*3/uL (ref 0.7–4.0)
LYMPHS PCT: 36 %
Monocytes Absolute: 0.2 10*3/uL (ref 0.1–1.0)
Monocytes Relative: 8 %
NEUTROS ABS: 1.5 10*3/uL — AB (ref 1.7–7.7)
NEUTROS PCT: 54 %

## 2016-11-30 LAB — I-STAT BETA HCG BLOOD, ED (MC, WL, AP ONLY)

## 2016-11-30 LAB — LIPASE, BLOOD: LIPASE: 25 U/L (ref 11–51)

## 2016-11-30 MED ORDER — ONDANSETRON 4 MG PO TBDP: 4.0000 mg | ORAL_TABLET | Freq: Three times a day (TID) | ORAL | 0 refills | Status: DC | PRN

## 2016-11-30 MED ORDER — PROMETHAZINE HCL 25 MG PO TABS: 25.0000 mg | ORAL_TABLET | Freq: Four times a day (QID) | ORAL | 0 refills | Status: DC | PRN

## 2016-11-30 MED ORDER — KETOROLAC TROMETHAMINE 30 MG/ML IJ SOLN
15.0000 mg | Freq: Once | INTRAMUSCULAR | Status: AC
  Administered 2016-11-30: 15 mg via INTRAVENOUS
  Filled 2016-11-30: qty 1

## 2016-11-30 MED ORDER — PROMETHAZINE HCL 25 MG/ML IJ SOLN
12.5000 mg | Freq: Once | INTRAMUSCULAR | Status: AC
Start: 2016-11-30 — End: 2016-11-30
  Administered 2016-11-30: 12.5 mg via INTRAVENOUS
  Filled 2016-11-30: qty 1

## 2016-11-30 NOTE — ED Provider Notes (Signed)
[facility] DEPT Provider Note   CSN: [id number] Arrival date & time: 11/30/16  0715     History   Chief Complaint Chief Complaint  Patient presents with  . Abdominal Pain    HPI Alicia Johnson is a 41 y.o. female.  HPI Patient presents with headache and tingling all over. States she has had headaches in the past. States she was previously diagnosed with migraines and was on a red pill but has not had too many of then for a while. Now patient is more severe. Dull frontal headache. No vision changes. Blood pressure has been a little high. Also has had nausea vomiting diarrhea that began with this episode. Has nausea but does not tend to have diarrhea with the episodes. Denies possibility of pregnancy since she had one of her tubes removed in the past.she also states that her tongue is tingling. Sometimes will have this with her headache. Past Medical History:  Diagnosis Date  . Ectopic pregnancy   . Migraine   . No pertinent past medical history     Patient Active Problem List   Diagnosis Date Noted  . 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 UTI 01/08/2008  . OTITIS MEDIA, SEROUS, CHRONIC 12/25/2007  . VAGINITIS 11/26/2007  . ARTHRALGIA 08/03/2007  . MYALGIA 08/03/2007  . VAGINAL DISCHARGE 04/06/2007  . PARESTHESIA 01/29/2007  . 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 ABDOMINAL PAIN, GENERALIZED, CHRONIC 01/25/2006  . ALLERGIC RHINITIS 12/15/2005  . REACTIVE AIRWAY DISEASE 12/15/2005    Past Surgical History:  Procedure Laterality Date  . DILATION AND CURETTAGE OF UTERUS    . ECTOPIC PREGNANCY SURGERY    . LAPAROSCOPY  01/14/2012   Procedure: LAPAROSCOPY OPERATIVE;  Surgeon: Farrel Gobble. Harrington Challenger, MD;  Location: [facility];  Service: Gynecology;  Laterality: N/A;  Left Salpingectomy  . UNILATERAL SALPINGECTOMY     left tube    OB History    Gravida Para Term Preterm AB Living   7       6     SAB TAB Ectopic Multiple Live Births   2 1 3             Home Medications    Prior to Admission medications   Medication Sig Start Date End Date Taking? Authorizing Provider  aspirin-acetaminophen-caffeine (EXCEDRIN EXTRA STRENGTH) [phone] MG tablet Take 2 tablets by mouth every 6 (six) hours as needed for headache or migraine.   Yes [provider]  NONI, MORINDA CITRIFOLIA, PO Take 1 tablet by mouth 2 (two) times daily. *1 seed/tablet twice a day*   Yes [provider]  OVER THE COUNTER MEDICATION Take 1 capsule by mouth 3 (three) times daily. *Stinging Nettle*   Yes [provider]  RED CLOVER LEAF EXTRACT PO Take 1 drop by mouth 2 (two) times daily.   Yes [provider]  ondansetron (ZOFRAN-ODT) 4 MG disintegrating tablet Take 1 tablet (4 mg total) by mouth every 8 (eight) hours as needed for nausea or vomiting. 11/30/16   Davonna Belling, MD  promethazine (PHENERGAN) 25 MG tablet Take 1 tablet (25 mg total) by mouth every 6 (six) hours as needed for nausea or vomiting. 11/30/16   Davonna Belling, MD    Family History Family History  Problem Relation Age of Onset  . Breast cancer Mother 13  . Breast cancer Maternal Aunt 49       tested negative for BRCAPlus  . Colon cancer Maternal Grandfather 100  . Other Neg  Hx     Social History Social History  Substance Use Topics  . Smoking status: Never Smoker  . Smokeless tobacco: Not on file  . Alcohol use No     Allergies   Patient has no known allergies.   Review of Systems Review of Systems  Constitutional: Negative for appetite change.  HENT: Negative for congestion.   Respiratory: Negative for cough.   Cardiovascular: Negative for chest pain.  Gastrointestinal: Positive for nausea and vomiting.  Genitourinary: Negative for flank pain.  Musculoskeletal: Negative for back pain.  Skin: Negative for rash.  Neurological: Negative for syncope.     Physical Exam Updated Vital Signs BP (!) 136/97 (BP Location: Right Arm)    Pulse 88   Temp 98.4 F (36.9 C) (Oral)   Resp 20   Ht 5\' 7"  (1.702 m)   Wt 86.2 kg (190 lb)   LMP 11/02/2016   SpO2 99%   BMI 29.76 kg/m   Physical Exam  Constitutional: She appears well-developed.  HENT:  Head: Atraumatic.  Eyes: Pupils are equal, round, and reactive to light.  Neck: Neck supple.  Cardiovascular: Normal rate.   Pulmonary/Chest: Effort normal.  Abdominal: Soft.  Musculoskeletal: She exhibits no edema.  Neurological: She is alert.  Moving all extremities equally.  Skin: Skin is warm. Capillary refill takes less than 2 seconds.  Psychiatric: She has a normal mood and affect.     ED Treatments / Results  Labs (all labs ordered are listed, but only abnormal results are displayed) Labs Reviewed  COMPREHENSIVE METABOLIC PANEL - Abnormal; Notable for the following:       Result Value   ALT 11 (*)    All other components within normal limits  CBC - Abnormal; Notable for the following:    WBC 2.9 (*)    All other components within normal limits  URINALYSIS, ROUTINE W REFLEX MICROSCOPIC - Abnormal; Notable for the following:    Color, Urine STRAW (*)    Hgb urine dipstick MODERATE (*)    Bacteria, UA RARE (*)    Squamous Epithelial / LPF 0-5 (*)    All other components within normal limits  DIFFERENTIAL - Abnormal; Notable for the following:    Neutro Abs 1.5 (*)    All other components within normal limits  LIPASE, BLOOD  I-STAT BETA HCG BLOOD, ED ([facility], [facility], [facility] ONLY)    EKG  EKG Interpretation None       Radiology Ct Head Wo Contrast  Result Date: 11/30/2016 CLINICAL DATA:  Tingling in the upper extremities. Generalized headaches since 3 a.m. with vomiting. History of migraine. EXAM: CT HEAD WITHOUT CONTRAST TECHNIQUE: Contiguous axial images were obtained from the base of the skull through the vertex without intravenous contrast. COMPARISON:  None. FINDINGS: Brain: No evidence of acute infarction, hemorrhage, hydrocephalus, extra-axial collection  or mass lesion/mass effect. Small low-density below the left putamen that is well-defined on reformats, favor dilated perivascular space. Vascular: No hyperdense vessel or unexpected calcification. Skull: Normal. Negative for fracture or focal lesion. Sinuses/Orbits: Trace opacification of right mastoid air cells. The nasopharynx is not covered on axial slices. IMPRESSION: No acute finding or explanation for headache. Electronically Signed   By: Monte Fantasia M.D.   On: 11/30/2016 09:26    Procedures Procedures (including critical care time)  Medications Ordered in ED Medications  ketorolac (TORADOL) 30 MG/ML injection 15 mg (15 mg Intravenous Given 11/30/16 0839)  promethazine (PHENERGAN) injection 12.5 mg (12.5 mg Intravenous Given 11/30/16 0843)  Initial Impression / Assessment and Plan / ED Course  I have reviewed the triage vital signs and the nursing notes.  Pertinent labs & imaging results that were available during my care of the patient were reviewed by me and considered in my medical decision making (see chart for details).     Patient with headache nausea vomiting diarrhea.  Labs reassuring.  Initially hypertensive but has improved somewhat.  Headache feels somewhat better.  Will discharge home with Zofran and Phenergan.  We will follow-up the primary care doctor.  Head CT done since she has had headaches but most now in the past and never had imaging.  Final Clinical Impressions(s) / ED Diagnoses   Final diagnoses:  History of migraine headaches  Hypertension, unspecified type  Nonintractable headache, unspecified chronicity pattern, unspecified headache type    New Prescriptions New Prescriptions   ONDANSETRON (ZOFRAN-ODT) 4 MG DISINTEGRATING TABLET    Take 1 tablet (4 mg total) by mouth every 8 (eight) hours as needed for nausea or vomiting.   PROMETHAZINE (PHENERGAN) 25 MG TABLET    Take 1 tablet (25 mg total) by mouth every 6 (six) hours as needed for nausea or  vomiting.     Davonna Belling, MD 11/30/16 1025

## 2016-11-30 NOTE — ED Triage Notes (Signed)
Pt complaint of generalized abdominal pain with associated n/v/d onset 0300.

## 2016-11-30 NOTE — ED Notes (Signed)
Pt. States she has had high blood pressure for the past 2 years. She prefers to use all natural remedies for the high blood pressure and symptoms. Pt. States she is worried and wants to make sure she is not having a stroke or anything else.

## 2017-04-07 ENCOUNTER — Ambulatory Visit: Payer: BLUE CROSS/BLUE SHIELD

## 2019-09-23 IMAGING — CT CT HEAD W/O CM
3 of 4 series · 14 of 47 positions shown, 16 images · non-contrast
Comparison: None.

CLINICAL DATA: Tingling in the upper extremities. Generalized
headaches since 3 a.m. with vomiting. History of migraine.

EXAM:
CT HEAD WITHOUT CONTRAST
TECHNIQUE: Contiguous axial images were obtained from the base of the skull
through the vertex without intravenous contrast.

[Series 2: head w/o · axial · non-contrast · 0.45mm/px · z∈[-146,-26]mm · 8 of 28 slices shown, 10 images]
[im 2/28  brain]
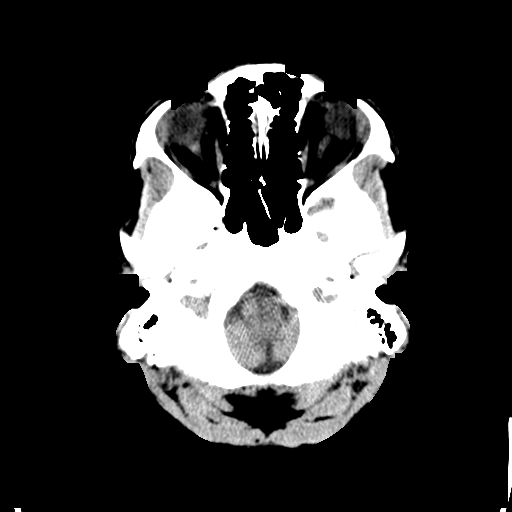
[im 2/28  bone]
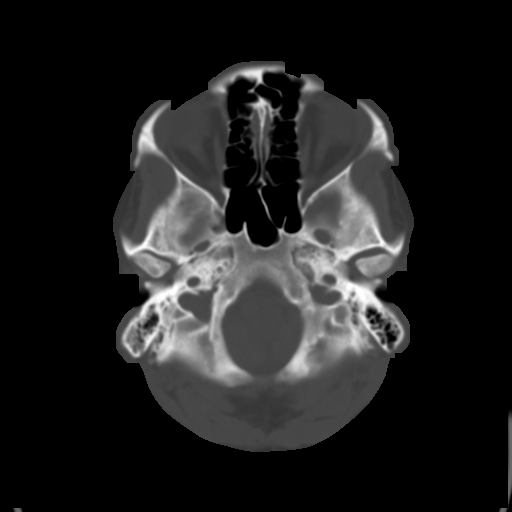
[im 6/28  brain]
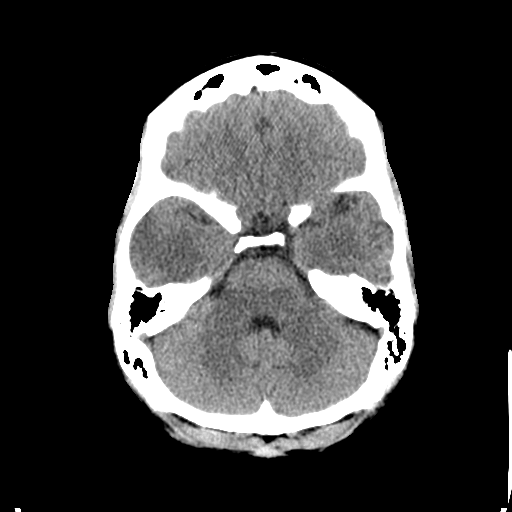
[im 10/28  brain]
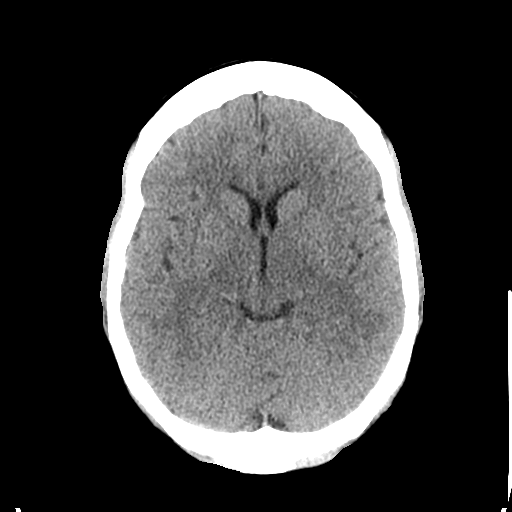
[im 12/28  brain]
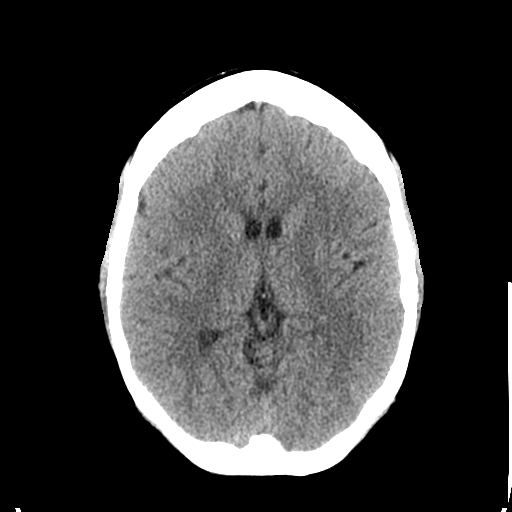
[im 16/28  brain]
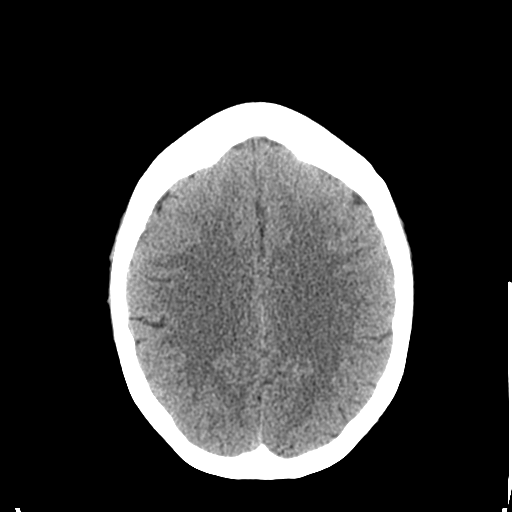
[im 16/28  bone]
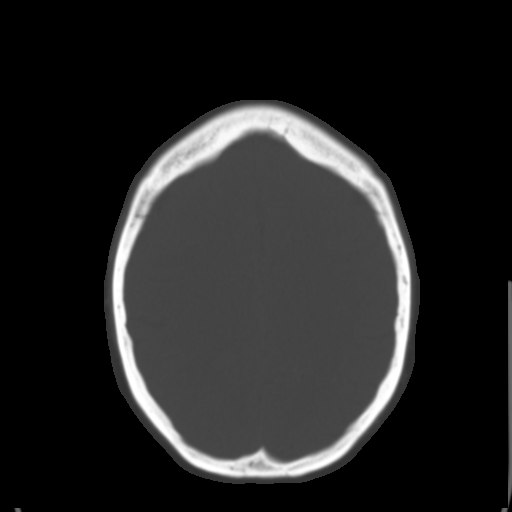
[im 18/28  brain]
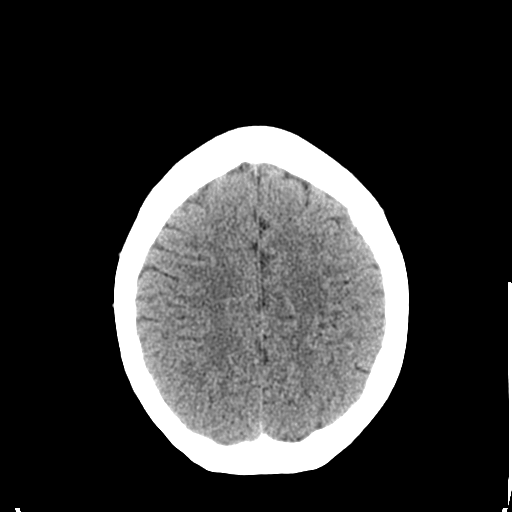
[im 22/28  brain]
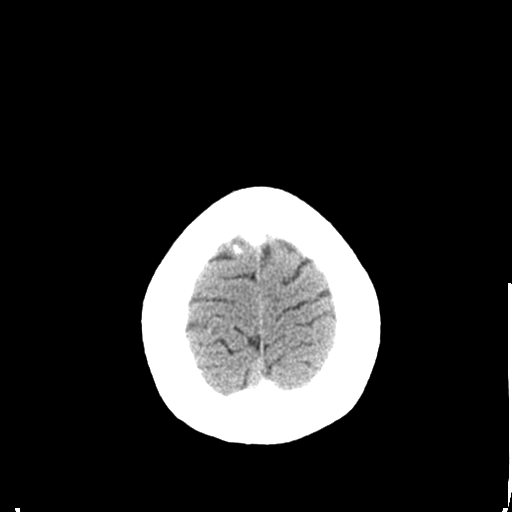
[im 26/28  brain]
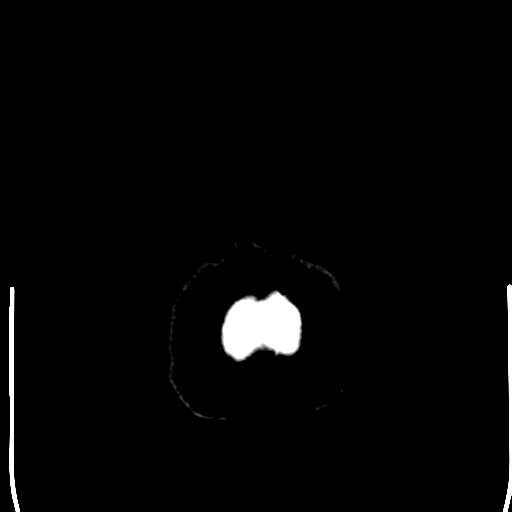

[Series 4: coronal · coronal · 0.27mm/px · 3 of 64 slices shown]
[im 22/64  brain]
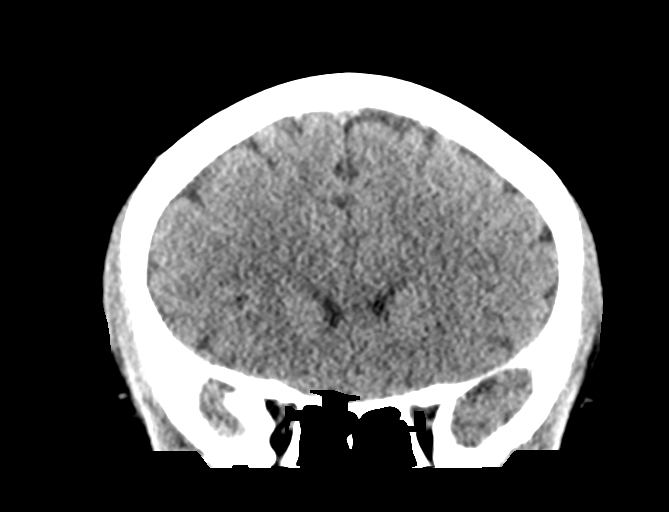
[im 29/64  brain]
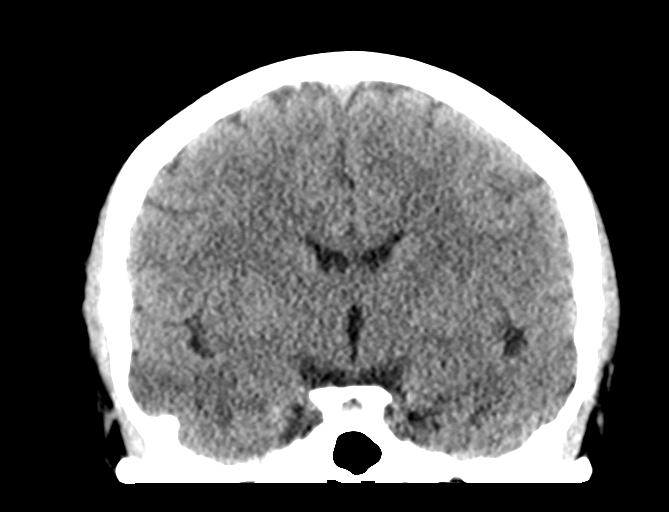
[im 36/64  brain]
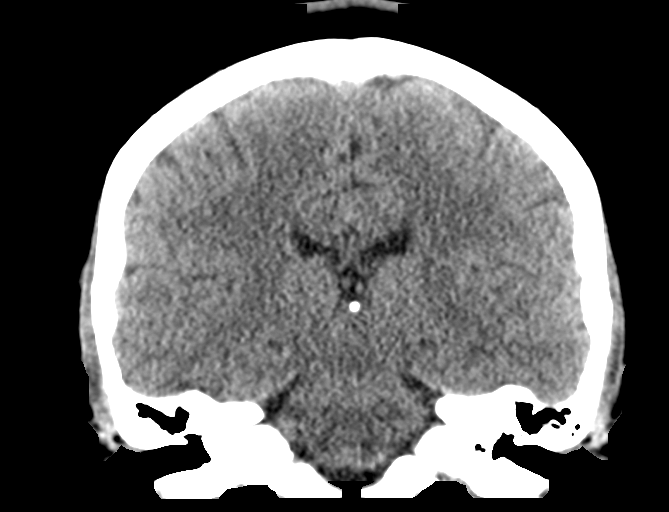

[Series 5: sagittal · sagittal · 0.29mm/px · 3 of 54 slices shown]
[im 18/54  brain]
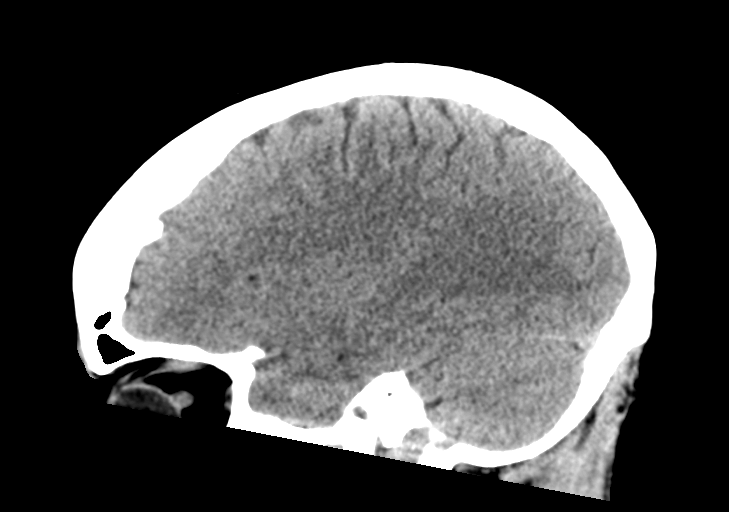
[im 27/54  brain]
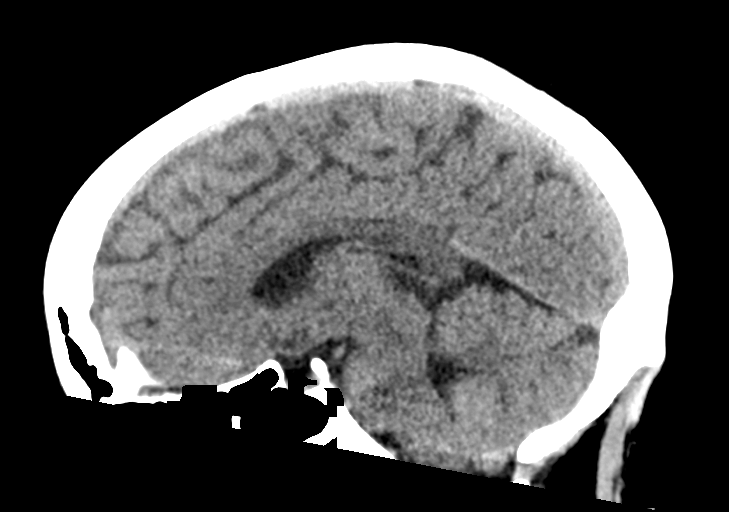
[im 36/54  brain]
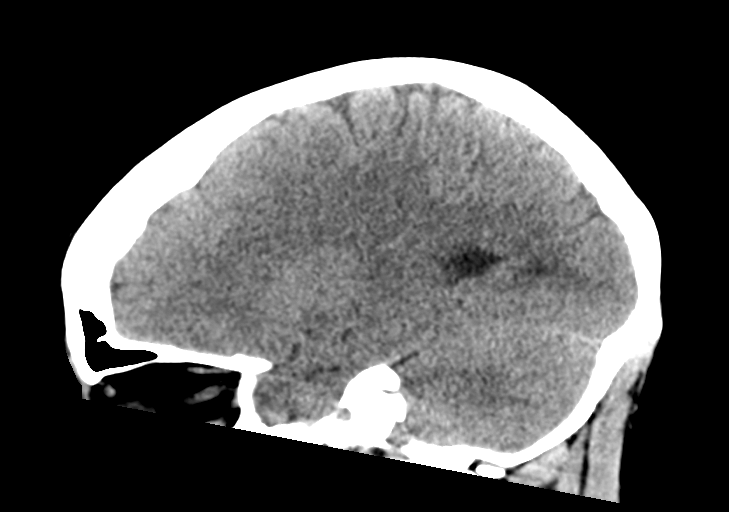

[14 of 47 positions shown; findings below may reference images not displayed]

FINDINGS: Brain: No evidence of acute infarction, hemorrhage, hydrocephalus,
extra-axial collection or mass lesion/mass effect. Small low-density
below the left putamen that is well-defined on reformats, favor
dilated perivascular space.

Vascular: No hyperdense vessel or unexpected calcification.

Skull: Normal. Negative for fracture or focal lesion.

Sinuses/Orbits: Trace opacification of right mastoid air cells. The
nasopharynx is not covered on axial slices.
IMPRESSION: No acute finding or explanation for headache.

## 2020-10-12 ENCOUNTER — Emergency Department (HOSPITAL_BASED_OUTPATIENT_CLINIC_OR_DEPARTMENT_OTHER)
Admission: EM | Admit: 2020-10-12 | Discharge: 2020-10-12 | Disposition: A | Discharge status: Home / Self Care | Payer: BC Managed Care – PPO | Attending: Emergency Medicine | Admitting: Emergency Medicine

## 2020-10-12 ENCOUNTER — Encounter (HOSPITAL_BASED_OUTPATIENT_CLINIC_OR_DEPARTMENT_OTHER): Payer: Self-pay | Admitting: *Deleted

## 2020-10-12 ENCOUNTER — Other Ambulatory Visit: Payer: Self-pay

## 2020-10-12 DIAGNOSIS — R110 Nausea: Secondary | ICD-10-CM

## 2020-10-12 DIAGNOSIS — J45909 Unspecified asthma, uncomplicated: Secondary | ICD-10-CM | POA: Insufficient documentation

## 2020-10-12 DIAGNOSIS — R519 Headache, unspecified: Secondary | ICD-10-CM | POA: Insufficient documentation

## 2020-10-12 DIAGNOSIS — Z85528 Personal history of other malignant neoplasm of kidney: Secondary | ICD-10-CM | POA: Diagnosis not present

## 2020-10-12 LAB — URINALYSIS, ROUTINE W REFLEX MICROSCOPIC
Bilirubin Urine: NEGATIVE
Glucose, UA: NEGATIVE mg/dL
Hgb urine dipstick: NEGATIVE
Ketones, ur: NEGATIVE mg/dL
Leukocytes,Ua: NEGATIVE
Nitrite: NEGATIVE
Protein, ur: NEGATIVE mg/dL
Specific Gravity, Urine: 1.01 (ref 1.005–1.030)
pH: 6 (ref 5.0–8.0)

## 2020-10-12 LAB — PREGNANCY, URINE: Preg Test, Ur: NEGATIVE

## 2020-10-12 MED ORDER — AMLODIPINE BESYLATE 5 MG PO TABS
5.0000 mg | ORAL_TABLET | Freq: Once | ORAL | Status: AC
  Administered 2020-10-12: 5 mg via ORAL
  Filled 2020-10-12: qty 1

## 2020-10-12 MED ORDER — ONDANSETRON 4 MG PO TBDP: 4.0000 mg | ORAL_TABLET | Freq: Three times a day (TID) | ORAL | 0 refills | Status: DC | PRN

## 2020-10-12 NOTE — Discharge Instructions (Addendum)
As we discussed believe some of your symptoms may be related to your elevated blood pressure I would talk to your primary care about restarting your blood pressure medication.  Additionally I think that some of your nausea may be related to recent dietary changes, as well as life stress.  If your nausea becomes intractable, or you develop new or worsening abdominal pain, vomiting, diarrhea please seek evaluation with your primary care, or GI doctor.

## 2020-10-12 NOTE — ED Provider Notes (Signed)
[facility] EMERGENCY DEPARTMENT Provider Note   CSN: [id number] Arrival date & time: 10/12/20  1449     History Chief Complaint  Patient presents with   Abdominal Pain    Alicia Johnson is a 45 y.o. female who presents with 4 days of on and off nausea, and headaches.  Patient reports that she has had a lot of life stress recently recently underwent to moves of address.  Additionally she reports that she is not taking her blood pressure medication at this time, and noted that it was elevated today so she is worried that blood pressure may be causing some of her headache.  Additionally she recently started to change her diet, reporting that she was eating at all times of the day and some unhealthy foods, however starting on the first of the month she switched to only eating raw vegetables and fruits, and 1 other meal later in the evening.  She has tried Excedrin for her headache without relief.  She denies chest pain, shortness of breath, vision changes, neck pain, vomiting, diarrhea, constipation, dysuria, urinary frequency.   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 Harrington Challenger, MD;  Location: [facility];  Service: Gynecology;  Laterality: N/A;  Left  Salpingectomy   UNILATERAL SALPINGECTOMY     left tube     OB History     Gravida  7   Para      Term      Preterm      AB  6   Living         SAB  2   IAB  1   Ectopic  3   Multiple      Live Births              Family History  Problem Relation Age of Onset   Breast cancer Mother 32   Breast cancer Maternal Aunt 68       tested negative for BRCAPlus   Colon cancer Maternal Grandfather 93   Other Neg Hx     Social History   Tobacco Use   Smoking status: Never   Smokeless tobacco: Never  Substance Use Topics   Alcohol use: No   Drug use: No    Home Medications Prior to Admission medications   Medication Sig Start Date End Date Taking? Authorizing Provider  ondansetron (ZOFRAN ODT) 4 MG disintegrating tablet Take 1 tablet (4 mg total) by mouth every 8 (eight) hours as needed for nausea or vomiting. 10/12/20  Yes Intisar Claudio H, PA-C  aspirin-acetaminophen-caffeine (EXCEDRIN EXTRA STRENGTH) [phone] MG tablet Take 2 tablets by mouth every 6 (six) hours as needed for headache or migraine.  [provider]  NONI, MORINDA CITRIFOLIA, PO Take 1 tablet by mouth 2 (two) times daily. *1 seed/tablet twice a day*    [provider]  OVER THE COUNTER MEDICATION Take 1 capsule by mouth 3 (three) times daily. *Stinging Nettle*    [provider]  promethazine (PHENERGAN) 25 MG tablet Take 1 tablet (25 mg total) by mouth every 6 (six) hours as needed for nausea or vomiting. 11/30/16   Davonna Belling, MD  RED CLOVER LEAF EXTRACT PO Take 1 drop by mouth 2 (two) times daily.    [provider]  fluticasone (FLONASE) 50 MCG/ACT nasal spray Place 2 sprays into both nostrils daily. 11/20/13 07/21/14  Presson, Audelia Hives, PA    Allergies    Patient has no known allergies.  Review of Systems   Review of Systems  Gastrointestinal:  Positive for abdominal pain.  Neurological:  Positive for headaches.  All other systems  reviewed and are negative.  Physical Exam Updated Vital Signs BP (!) 170/112 (BP Location: Left Arm)   Pulse 93   Temp 98.3 F (36.8 C) (Oral)   Resp 18   Ht '5\' 7"'$  (1.702 m)   Wt 80.5 kg   LMP 10/02/2020   SpO2 100%   BMI 27.78 kg/m   Physical Exam Vitals and nursing note reviewed.  Constitutional:      General: She is not in acute distress.    Appearance: Normal appearance.  HENT:     Head: Normocephalic and atraumatic.  Eyes:     General:        Right eye: No discharge.        Left eye: No discharge.  Cardiovascular:     Rate and Rhythm: Normal rate and regular rhythm.     Heart sounds: No murmur heard.   No friction rub. No gallop.  Pulmonary:     Effort: Pulmonary effort is normal.     Breath sounds: Normal breath sounds.  Abdominal:     General: Bowel sounds are normal.     Palpations: Abdomen is soft.     Comments: No tenderness to palpation of entire abdomen, other than minimal tenderness in the lower quadrant, suprapubic region.  No rebound, rigidity, guarding.   Skin:    General: Skin is warm and dry.     Capillary Refill: Capillary refill takes less than 2 seconds.  Neurological:     Mental Status: She is alert and oriented to person, place, and time.  Psychiatric:        Mood and Affect: Mood normal.        Behavior: Behavior normal.    ED Results / Procedures / Treatments   Labs (all labs ordered are listed, but only abnormal results are displayed) Labs Reviewed  URINALYSIS, ROUTINE W REFLEX MICROSCOPIC - Abnormal; Notable for the following components:      Result Value   Color, Urine STRAW (*)    All other components within normal limits  PREGNANCY, URINE    EKG None  Radiology No results found.  Procedures Procedures   Medications Ordered in ED Medications  amLODipine (NORVASC) tablet 5 mg (has no administration in time range)    ED Course  I have reviewed the triage vital signs and the nursing notes.  Pertinent labs & imaging  results that were available during my care of the patient were reviewed by me and considered in my medical decision making (see chart for details).    MDM Rules/Calculators/A&P  Discussed her urinalysis, urine pregnancy test are negative.  Based on patient history, and physical presentation today no worry for acute abdominal etiology at this time.  We discussed that her nausea may be secondary to recent dietary changes, as well as life stressors.  We will provide Zofran medication to go home with.  Discussed that her headache may be secondary to her elevated blood pressure.  Discussed speaking with her primary care doctor about getting back on her blood pressure medication.  No red flag symptoms of headache including sudden onset, vision changes, neurologic findings.  Recommend patient follow-up with her PCP and GI if her nausea does not resolve.  Discussed return precautions. Final Clinical Impression(s) / ED Diagnoses Final diagnoses:  Nausea  Acute nonintractable headache, unspecified headache type    Rx / DC Orders ED Discharge Orders          Ordered    ondansetron (ZOFRAN ODT) 4 MG disintegrating tablet  Every 8 hours PRN        10/12/20 1628             Yochanan Eddleman, Joesph Fillers, PA-C 10/12/20 1640    Deno Etienne, DO 10/12/20 1902

## 2020-10-12 NOTE — ED Triage Notes (Signed)
Headache and nausea on and off x 3 weeks.

## 2022-03-26 ENCOUNTER — Ambulatory Visit
Admission: EM | Admit: 2022-03-26 | Discharge: 2022-03-26 | Disposition: A | Discharge status: Home / Self Care | Payer: BC Managed Care – PPO

## 2022-03-26 DIAGNOSIS — H9201 Otalgia, right ear: Secondary | ICD-10-CM | POA: Diagnosis not present

## 2022-03-26 DIAGNOSIS — T161XXA Foreign body in right ear, initial encounter: Secondary | ICD-10-CM

## 2022-03-26 NOTE — ED Provider Notes (Signed)
[facility] - URGENT [facility]  Note:  This document was prepared using Systems analyst and may include unintentional dictation errors.  MRN: [id number] DOB: 09-Feb-1975  Subjective:   Alicia Johnson is a 47 y.o. female presenting for 2-day history of persistent right ear discomfort.  Patient feels that there is a Q-tip stuck in her ear.  Use this in the middle of the night after she woke up.  Did not see the cotton tip on the end of the stick afterward.  No drainage, fever.  No current facility-administered medications for this encounter.  Current Outpatient Medications:    aspirin-acetaminophen-caffeine (EXCEDRIN EXTRA STRENGTH) [id number] MG tablet, Take 2 tablets by mouth every 6 (six) hours as needed for headache or migraine., Disp: , Rfl:    NONI, MORINDA CITRIFOLIA, PO, Take 1 tablet by mouth 2 (two) times daily. *1 seed/tablet twice a day*, Disp: , Rfl:    ondansetron (ZOFRAN ODT) 4 MG disintegrating tablet, Take 1 tablet (4 mg total) by mouth every 8 (eight) hours as needed for nausea or vomiting., Disp: 20 tablet, Rfl: 0   OVER THE COUNTER MEDICATION, Take 1 capsule by mouth 3 (three) times daily. *Stinging Nettle*, Disp: , Rfl:    promethazine (PHENERGAN) 25 MG tablet, Take 1 tablet (25 mg total) by mouth every 6 (six) hours as needed for nausea or vomiting., Disp: 10 tablet, Rfl: 0   RED CLOVER LEAF EXTRACT PO, Take 1 drop by mouth 2 (two) times daily., Disp: , Rfl:    No Known Allergies  Past Medical History:  Diagnosis Date   Ectopic pregnancy    Migraine    No pertinent past medical history      Past Surgical History:  Procedure Laterality Date   DILATION AND CURETTAGE OF UTERUS     ECTOPIC PREGNANCY SURGERY     LAPAROSCOPY  01/14/2012   Procedure: LAPAROSCOPY OPERATIVE;  Surgeon: Farrel Gobble. Harrington Challenger, MD;  Location: [facility];  Service: Gynecology;  Laterality: N/A;  Left Salpingectomy   UNILATERAL SALPINGECTOMY     left tube    Family History   Problem Relation Age of Onset   Breast cancer Mother 61   Breast cancer Maternal Aunt 63       tested negative for BRCAPlus   Colon cancer Maternal Grandfather 38   Other Neg Hx     Social History   Tobacco Use   Smoking status: Never   Smokeless tobacco: Never  Vaping Use   Vaping Use: Never used  Substance Use Topics   Alcohol use: No   Drug use: No    ROS   Objective:   Vitals: BP (!) 166/111 (BP Location: Right Arm)   Pulse 100   Temp 99.8 F (37.7 C) (Oral)   Resp 18   LMP 02/26/2022   SpO2 98%   Physical Exam Constitutional:      General: She is not in acute distress.    Appearance: Normal appearance. She is well-developed. She is not ill-appearing, toxic-appearing or diaphoretic.  HENT:     Head: Normocephalic and atraumatic.     Right Ear: Tympanic membrane, ear canal and external ear normal.     Ears:     Comments: Obvious for the body of the right ear canal.    Nose: Nose normal.     Mouth/Throat:     Mouth: Mucous membranes are moist.  Eyes:     General: No scleral icterus.       Right  eye: No discharge.        Left eye: No discharge.     Extraocular Movements: Extraocular movements intact.  Cardiovascular:     Rate and Rhythm: Normal rate.  Pulmonary:     Effort: Pulmonary effort is normal.  Skin:    General: Skin is warm and dry.  Neurological:     General: No focal deficit present.     Mental Status: She is alert and oriented to person, place, and time.  Psychiatric:        Mood and Affect: Mood normal.        Behavior: Behavior normal.    Alligator forceps were used togehter with the otoscope to visualize and remove the foreign body.  This was revealed to be the cotton tip of a Q-tip.  It was removed in its entirety without incident.  Assessment and Plan :   PDMP not reviewed this encounter.  1. Right ear pain   2. Acute foreign body of right ear canal, initial encounter     No signs of ear infection.,  Physical exam  documented after foreign body removal.  Anticipatory guidance provided.   Jaynee Eagles, PA-C 03/26/22 1351

## 2022-03-26 NOTE — ED Triage Notes (Signed)
Pt c/o qtip cotton in right ear x 2 days-NAD-steady gait

## 2022-04-13 ENCOUNTER — Ambulatory Visit (HOSPITAL_COMMUNITY)
Admission: EM | Admit: 2022-04-13 | Discharge: 2022-04-13 | Disposition: A | Discharge status: Home / Self Care | Payer: BC Managed Care – PPO

## 2022-04-13 ENCOUNTER — Encounter (HOSPITAL_COMMUNITY): Payer: Self-pay

## 2022-04-13 DIAGNOSIS — I10 Essential (primary) hypertension: Secondary | ICD-10-CM

## 2022-04-13 DIAGNOSIS — Z8669 Personal history of other diseases of the nervous system and sense organs: Secondary | ICD-10-CM

## 2022-04-13 DIAGNOSIS — R0981 Nasal congestion: Secondary | ICD-10-CM

## 2022-04-13 MED ORDER — AMLODIPINE BESYLATE 5 MG PO TABS: 5.0000 mg | ORAL_TABLET | Freq: Every day | ORAL | 0 refills | Status: AC

## 2022-04-13 MED ORDER — FLUTICASONE PROPIONATE 50 MCG/ACT NA SUSP: 2.0000 | Freq: Every day | NASAL | 2 refills | Status: AC

## 2022-04-13 MED ORDER — AMITRIPTYLINE HCL 25 MG PO TABS: 25.0000 mg | ORAL_TABLET | Freq: Every day | ORAL | 0 refills | Status: AC

## 2022-04-13 MED ORDER — GUAIFENESIN ER 600 MG PO TB12: 1200.0000 mg | ORAL_TABLET | Freq: Two times a day (BID) | ORAL | 0 refills | Status: AC

## 2022-04-13 NOTE — Discharge Instructions (Signed)
I have refilled your 5 mg amlodipine, please monitor your blood pressure twice daily and keep a record of this to bring to your primary care appointment.  I have also refilled your amitriptyline for your migraines.  Please consider keeping a migraine headache log as well.  It is imperative that you get established with a primary care provider so they can manage your chronic conditions.  Please take the Mucinex and Flonase for your nasal congestion, I have called both you sent to your pharmacy.  Please ensure you are drinking at least 64 ounces of water daily as well.

## 2022-04-13 NOTE — ED Triage Notes (Signed)
Pt is here for nasal congestion and runny nose x 2days, pt also needs a refill on b/p meds , and migraine meds . Pt has been out of b/p meds for a long time .

## 2022-04-13 NOTE — ED Provider Notes (Signed)
[facility]    CSN: [id number] Arrival date & time: 04/13/22  0803      History   Chief Complaint Chief Complaint  Patient presents with   Nasal Congestion    HPI Alicia Johnson is a 47 y.o. female.   Patient presents to clinic requesting blood pressure and migraine medication refill.  She does not currently have a primary care provider and recognizes that she does need to get established.  She reports being on amlodipine and hydrochlorothiazide years ago.  Over the past month she has been having an intermittent headache, was unsure if this was due to migraines or her unmanaged hypertension. She was seen at the UC on 03/26/22 and had HTN of 166/111.   She also has had nasal congestion for the past 4 days.  Denies cough, shortness of breath, sore throat, body aches or fevers.   The history is provided by the patient.    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 Harrington Challenger, MD;  Location: [facility];  Service: Gynecology;  Laterality: Johnson/A;  Left Salpingectomy   UNILATERAL SALPINGECTOMY     left tube    OB History     Gravida  7   Para      Term      Preterm      AB  6   Living         SAB  2   IAB  1   Ectopic  3   Multiple      Live Births                Home Medications    Prior to Admission medications   Medication Sig Start Date End Date Taking? Authorizing Provider  amitriptyline (ELAVIL) 25 MG tablet Take 1 tablet (25 mg total) by mouth at bedtime. 04/13/22 05/13/22 Yes Alicia Johnson, Alicia N, FNP  amLODipine (NORVASC) 5 MG tablet Take 1 tablet (5 mg total) by mouth daily. 04/13/22 05/13/22 Yes Alicia Johnson, Alicia N, FNP  fluticasone [facility]) 50 MCG/ACT nasal spray Place 2 sprays into both nostrils daily for 5 days. 04/13/22 04/18/22 Yes Alicia Johnson, Alicia N, FNP  guaiFENesin (MUCINEX) 600 MG 12 hr tablet Take 2 tablets (1,200 mg total) by mouth 2 (two) times daily for 10 days. 04/13/22 04/23/22 Yes Dominque Levandowski, Alicia N, FNP  NONI, MORINDA CITRIFOLIA, PO Take 1 tablet by mouth 2 (two) times daily. *1 seed/tablet twice a day*    [provider]    Family History Family History  Problem Relation Age of Onset   Breast cancer Mother 35   Breast cancer Maternal Aunt 1       tested negative for BRCAPlus   Colon cancer Maternal Grandfather 32  Other Neg Hx     Social History Social History   Tobacco Use   Smoking status: Never   Smokeless tobacco: Never  Vaping Use   Vaping Use: Never used  Substance Use Topics   Alcohol use: No   Drug use: No     Allergies   Patient has no known allergies.   Review of Systems Review of Systems  Constitutional:  Negative for chills and fever.  HENT:  Positive for postnasal drip and rhinorrhea. Negative for ear pain, sinus pressure, sinus pain and sore throat.   Eyes:  Negative for pain and visual disturbance.  Respiratory:  Negative for cough and shortness of breath.   Cardiovascular:  Negative for chest pain and palpitations.  Gastrointestinal:  Negative for abdominal pain and vomiting.  Genitourinary:  Negative for dysuria and hematuria.  Musculoskeletal:  Negative for arthralgias and back pain.  Skin:  Negative for color change and rash.  Neurological:  Positive for headaches.  Negative for seizures and syncope.  All other systems reviewed and are negative.    Physical Exam Triage Vital Signs ED Triage Vitals [04/13/22 0837]  Enc Vitals Group     BP (!) 134/97     Pulse Rate 98     Resp 12     Temp 98 F (36.7 C)     Temp Source Oral     SpO2 98 %     Weight      Height      Head Circumference      Peak Flow      Pain Score      Pain Loc      Pain Edu?      Excl. in [place]?    No data found.  Updated Vital Signs BP (!) 134/97 (BP Location: Left Arm)   Pulse 98   Temp 98 F (36.7 C) (Oral)   Resp 12   LMP 04/01/2022   SpO2 98%   Visual Acuity Right Eye Distance:   Left Eye Distance:   Bilateral Distance:    Right Eye Near:   Left Eye Near:    Bilateral Near:     Physical Exam Vitals and nursing note reviewed.  Constitutional:      General: She is not in acute distress.    Appearance: Normal appearance. She is well-developed.     Comments: Pleasant 47 year old female who appears stated age.  HENT:     Head: Normocephalic and atraumatic.     Right Ear: External ear normal.     Left Ear: External ear normal.     Nose: Rhinorrhea present.     Mouth/Throat:     Mouth: Mucous membranes are moist.     Pharynx: No posterior oropharyngeal erythema.  Eyes:     General: No scleral icterus.       Right eye: No discharge.        Left eye: No discharge.     Extraocular Movements: Extraocular movements intact.     Conjunctiva/sclera: Conjunctivae normal.     Pupils: Pupils are equal, round, and reactive to light.  Cardiovascular:     Rate and Rhythm: Normal rate and regular rhythm.     Heart sounds: Normal heart sounds, S1 normal and S2 normal. No murmur heard. Pulmonary:     Effort: Pulmonary effort is normal. No respiratory distress.     Breath sounds: Normal breath sounds.     Comments: Lungs vesicular posteriorly. Abdominal:     Palpations: Abdomen  is soft.     Tenderness: There is no abdominal tenderness.  Musculoskeletal:         General: No swelling. Normal range of motion.     Cervical back: Normal range of motion and neck supple.  Lymphadenopathy:     Cervical: No cervical adenopathy.  Skin:    General: Skin is warm and dry.     Capillary Refill: Capillary refill takes less than 2 seconds.  Neurological:     General: No focal deficit present.     Mental Status: She is alert and oriented to person, place, and time.     GCS: GCS eye subscore is 4. GCS verbal subscore is 5. GCS motor subscore is 6.     Cranial Nerves: Cranial nerves 2-12 are intact.  Psychiatric:        Mood and Affect: Mood normal.        Behavior: Behavior is cooperative.      UC Treatments / Results  Labs (all labs ordered are listed, but only abnormal results are displayed) Labs Reviewed - No data to display  EKG   Radiology No results found.  Procedures Procedures (including critical care time)  Medications Ordered in UC Medications - No data to display  Initial Impression / Assessment and Plan / UC Course  I have reviewed the triage vital signs and the nursing notes.  Pertinent labs & imaging results that were available during my care of the patient were reviewed by me and considered in my medical decision making (see chart for details).  Vitals and triage note reviewed, patient is hypertensive on exam with history of same.  Without severe headache or changes in vision, suspect that her intermittent headaches are either due to migraines or hypertension.  Will start her on 5 mg of amlodipine and encourage follow-up with primary care.  Will also refill amitriptyline.  Physical exam with postnasal drip, lungs vesicular, low concern for bacterial etiology, will treat symptomatically.  Plan of care and return precautions view, patient verbalized understanding.    Final Clinical Impressions(s) / UC Diagnoses   Final diagnoses:  Nasal congestion  Essential hypertension  History of migraine headaches     Discharge  Instructions      I have refilled your 5 mg amlodipine, please monitor your blood pressure twice daily and keep a record of this to bring to your primary care appointment.  I have also refilled your amitriptyline for your migraines.  Please consider keeping a migraine headache log as well.  It is imperative that you get established with a primary care provider so they can manage your chronic conditions.  Please take the Mucinex and Flonase for your nasal congestion, I have called both you sent to your pharmacy.  Please ensure you are drinking at least 64 ounces of water daily as well.      ED Prescriptions     Medication Sig Dispense Auth. Provider   amitriptyline (ELAVIL) 25 MG tablet Take 1 tablet (25 mg total) by mouth at bedtime. 30 tablet Alicia Johnson, Alicia Johnson, [place]   amLODipine (NORVASC) 5 MG tablet Take 1 tablet (5 mg total) by mouth daily. 30 tablet Alicia Johnson, Alicia Johnson, [place]   guaiFENesin (MUCINEX) 600 MG 12 hr tablet Take 2 tablets (1,200 mg total) by mouth 2 (two) times daily for 10 days. 40 tablet Alicia Johnson, Alicia Johnson, [place]   fluticasone [facility]) 50 MCG/ACT nasal spray Place 2 sprays into both nostrils daily for 5 days. 9.9 mL Orie Baxendale, Alicia N,  FNP      I have reviewed the PDMP during this encounter.   Alicia Johnson Alicia Johnson, [place] 04/13/22 [phone]

## 2023-07-15 ENCOUNTER — Other Ambulatory Visit: Payer: Self-pay

## 2023-07-15 ENCOUNTER — Ambulatory Visit
Admission: EM | Admit: 2023-07-15 | Discharge: 2023-07-15 | Disposition: A | Discharge status: Home / Self Care | Attending: Family Medicine | Admitting: Family Medicine

## 2023-07-15 DIAGNOSIS — R09A9 Foreign body sensation, other site: Secondary | ICD-10-CM | POA: Diagnosis not present

## 2023-07-15 DIAGNOSIS — H6991 Unspecified Eustachian tube disorder, right ear: Secondary | ICD-10-CM

## 2023-07-15 NOTE — ED Triage Notes (Signed)
 Pt got cue tip stuck in her RT ear 2 days ago. Pt has not been able to visualize anything to try to get it out. Pt c/o of some discomfort.

## 2023-07-15 NOTE — ED Provider Notes (Signed)
 [facility]    CSN: [id number] Arrival date & time: 07/15/23  1352      History   Chief Complaint Chief Complaint  Patient presents with   Foreign Body in Ear    HPI Alicia Johnson is a 48 y.o. female.    Foreign Body in Ear  Suspected foreign body right ear canal states tip of Q-tip broke off 2 days ago, since then has had fullness in the ear, had some discomfort when she touched behind her ear today.  Admits muffled hearing.  Denies bleeding or drainage from ear, swelling, possible other injury.  Admits nasal congestion and rhinorrhea associated with sneezing which she believes is due to allergies.  She started her Flonase  yesterday. 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 Avanell Bob, MD;  Location: [facility];  Service: Gynecology;  Laterality: N/A;  Left Salpingectomy   UNILATERAL SALPINGECTOMY     left tube    OB History     Gravida  7   Para      Term      Preterm      AB  6   Living         SAB  2   IAB  1   Ectopic  3   Multiple      Live Births               Home Medications    Prior to Admission medications   Medication Sig Start Date End Date Taking?  Authorizing Provider  amitriptyline  (ELAVIL ) 25 MG tablet Take 1 tablet (25 mg total) by mouth at bedtime. 04/13/22 05/13/22  Harlow Lighter, [place]  N, FNP  amLODipine  (NORVASC ) 5 MG tablet Take 1 tablet (5 mg total) by mouth daily. 04/13/22 05/13/22  Harlow Lighter, [place]  N, FNP  fluticasone  (FLONASE ) 50 MCG/ACT nasal spray Place 2 sprays into both nostrils daily for 5 days. 04/13/22 04/18/22  Harlow Lighter, [place]  N, FNP  NONI, MORINDA CITRIFOLIA, PO Take 1 tablet by mouth 2 (two) times daily. *1 seed/tablet twice a day*    [provider]    Family History Family History  Problem Relation Age of Onset   Breast cancer Mother 91   Breast cancer Maternal Aunt 3       tested negative for BRCAPlus   Colon cancer Maternal Grandfather 23   Other Neg Hx     Social History Social History   Tobacco Use   Smoking status: Never   Smokeless tobacco: Never  Vaping Use   Vaping status: Never Used  Substance Use Topics   Alcohol use: No   Drug use: No     Allergies   Patient  has no known allergies.   Review of Systems Review of Systems   Physical Exam Triage Vital Signs ED Triage Vitals  Encounter Vitals Group     BP 07/15/23 1405 (!) 164/109     Systolic BP Percentile --      Diastolic BP Percentile --      Pulse Rate 07/15/23 1405 96     Resp 07/15/23 1405 20     Temp 07/15/23 1405 98.6 F (37 C)     Temp Source 07/15/23 1405 Oral     SpO2 07/15/23 1405 98 %     Weight --      Height --      Head Circumference --      Peak Flow --      Pain Score 07/15/23 1402 3     Pain Loc --      Pain Education --      Exclude from Growth Chart --    No data found.  Updated Vital Signs BP (!) 164/109 (BP Location: Right Arm)   Pulse 96   Temp 98.6 F (37 C) (Oral)   Resp 20   LMP 07/08/2023 (Exact Date)   SpO2 98%   Breastfeeding No   Visual Acuity Right Eye Distance:   Left Eye Distance:   Bilateral Distance:    Right Eye Near:   Left Eye Near:    Bilateral Near:      Physical Exam Vitals and nursing note reviewed.  Constitutional:      Appearance: She is not ill-appearing.  HENT:     Head: Normocephalic and atraumatic.     Right Ear: Tympanic membrane and ear canal normal. There is no impacted cerumen.     Left Ear: Tympanic membrane and ear canal normal. There is no impacted cerumen.     Nose: Congestion present. No rhinorrhea.     Mouth/Throat:     Mouth: Mucous membranes are moist.     Pharynx: Oropharynx is clear. No oropharyngeal exudate or posterior oropharyngeal erythema.     Comments: Swallowing, clear voice Eyes:     Conjunctiva/sclera: Conjunctivae normal.  Cardiovascular:     Rate and Rhythm: Normal rate.  Pulmonary:     Effort: Pulmonary effort is normal.  Musculoskeletal:     Cervical back: Neck supple.  Lymphadenopathy:     Cervical: No cervical adenopathy.  Skin:    General: Skin is warm.  Neurological:     Mental Status: She is alert and oriented to person, place, and time.  Psychiatric:        Mood and Affect: Mood normal.      UC Treatments / Results  Labs (all labs ordered are listed, but only abnormal results are displayed) Labs Reviewed - No data to display  EKG   Radiology No results found.  Procedures Procedures (including critical care time)  Medications Ordered in UC Medications - No data to display  Initial Impression / Assessment and Plan / UC Course  I have reviewed the triage vital signs and the nursing notes.  Pertinent labs & imaging results that were available during my care of the patient were reviewed by me and considered in my medical decision making (see chart for details).   48 year old female with possible foreign body right ear canal x 2 days presents with right ear discomfort and fullness.  She is hypertensive at 164/109 otherwise vital signs are normal, her right ear canal is clear there is no foreign body no swelling no  lesions, TM is intact there is no bleeding, erythema  retraction or bulging.  Discussed with patient could be eustachian tube dysfunction given her admission to nasal congestion and rhinorrhea recommend she continue her Flonase  2 sprays each nostril add an antihistamine follow-up as needed. Final Clinical Impressions(s) / UC Diagnoses   Final diagnoses:  Foreign body sensation in ear canal, right  Dysfunction of right eustachian tube     Discharge Instructions      Continue your Flonase  2 sprays each nostril once daily, consider adding an antihistamine such as Claritin, Allegra or Zyrtec  as directed on the package.  You could use Sudafed for 2 to 3 days to help with nasal congestion Follow-up as needed  ED Prescriptions   None    PDMP not reviewed this encounter.   Shaterra Sanzone, [place] 07/15/23 1428

## 2023-07-15 NOTE — Discharge Instructions (Signed)
 Continue your Flonase  2 sprays each nostril once daily, consider adding an antihistamine such as Claritin, Allegra or Zyrtec  as directed on the package.  You could use Sudafed for 2 to 3 days to help with nasal congestion Follow-up as needed
